# Patient Record
Sex: Female | Born: 1985 | Race: White | Hispanic: No | Marital: Married | Smoking: Never smoker
Health system: Southern US, Community
[De-identification: ages and names within clinical notes are randomized; demographics above are authoritative.]

## PROBLEM LIST (undated history)

## (undated) DIAGNOSIS — E041 Nontoxic single thyroid nodule: Secondary | ICD-10-CM

## (undated) DIAGNOSIS — R87629 Unspecified abnormal cytological findings in specimens from vagina: Secondary | ICD-10-CM

## (undated) HISTORY — DX: Nontoxic single thyroid nodule: E04.1

## (undated) HISTORY — DX: Unspecified abnormal cytological findings in specimens from vagina: R87.629

## (undated) HISTORY — PX: CHOLECYSTECTOMY: SHX55

---

## 2016-07-19 ENCOUNTER — Encounter: Payer: Self-pay | Admitting: Adult Health

## 2016-07-19 ENCOUNTER — Ambulatory Visit (INDEPENDENT_AMBULATORY_CARE_PROVIDER_SITE_OTHER): Payer: Medicaid Other | Admitting: Adult Health

## 2016-07-19 VITALS — BP 90/70 | HR 76 | Ht 65.4 in | Wt 158.0 lb

## 2016-07-19 DIAGNOSIS — Z3202 Encounter for pregnancy test, result negative: Secondary | ICD-10-CM

## 2016-07-19 DIAGNOSIS — Z319 Encounter for procreative management, unspecified: Secondary | ICD-10-CM

## 2016-07-19 DIAGNOSIS — Z3169 Encounter for other general counseling and advice on procreation: Secondary | ICD-10-CM

## 2016-07-19 LAB — POCT URINE PREGNANCY: PREG TEST UR: NEGATIVE

## 2016-07-19 MED ORDER — PRENATAL PLUS 27-1 MG PO TABS: 1.0000 | ORAL_TABLET | Freq: Every day | ORAL | 12 refills | Status: DC

## 2016-07-19 NOTE — Progress Notes (Signed)
Subjective:     Patient ID: Idelle JoGena Clites, female   DOB: 08/11/1985, 31 y.o.   MRN: [id number]  HPI Roslynn AmbleGena is a 31 year old white female, new to this practice in to discuss getting pregnant, has been trying for about a year.She has [facility] pap [facility] in [place], she says.   Review of Systems Patient denies any headaches, hearing loss, fatigue, blurred vision, shortness of breath, chest pain, abdominal pain, problems with bowel movements, urination, or intercourse. No joint pain or mood swings.Periods regular.  Reviewed past medical,surgical, social and family history. Reviewed medications and allergies.     Objective:   Physical Exam BP 90/70 (BP Location: Right Arm, Patient Position: Sitting, Cuff Size: Small)   Pulse 76   Ht 5' 5.4" (1.661 m)   Wt 158 lb (71.7 kg)   LMP 07/08/2016   BMI 25.97 kg/m UPT negative,Skin warm and dry. Neck: mid line trachea, normal thyroid, good ROM, no lymphadenopathy noted. Lungs: clear to ausculation bilaterally. Cardiovascular: regular rate and rhythm.   Discussed ovulation and timing of sex, will check progesterone level day 21 of cycle, and may need semen analysis(partner 48 and has no kids).  Assessment:     1. Patient desires pregnancy   2. Pregnancy examination or test, negative result       Plan:     Meds ordered this encounter  Medications  . prenatal vitamin w/FE, FA (PRENATAL 1 + 1) 27-1 MG TABS tablet    Sig: Take 1 tablet by mouth daily at 12 noon.    Dispense:  30 each    Refill:  12    Order Specific Question:   Supervising Provider    Answer:   Lazaro ArmsEURE, LUTHER H [[id number]]  Check progesterone level 7/27 Have sex every other day, days 7-24 of cycle Follow up in 3 months or before if +HPT,[facility]

## 2016-07-29 LAB — PROGESTERONE: Progesterone: 8.1 ng/mL

## 2016-07-31 ENCOUNTER — Telehealth: Payer: Self-pay | Admitting: Adult Health

## 2016-07-31 NOTE — Telephone Encounter (Signed)
Left message that progesterone level 8.1. Looks like ovulated this month

## 2016-10-19 ENCOUNTER — Ambulatory Visit: Payer: Medicaid Other | Admitting: Adult Health

## 2016-10-23 ENCOUNTER — Ambulatory Visit (INDEPENDENT_AMBULATORY_CARE_PROVIDER_SITE_OTHER): Payer: Medicaid Other | Admitting: Women's Health

## 2016-10-23 ENCOUNTER — Encounter (INDEPENDENT_AMBULATORY_CARE_PROVIDER_SITE_OTHER): Payer: Self-pay

## 2016-10-23 ENCOUNTER — Encounter: Payer: Self-pay | Admitting: Women's Health

## 2016-10-23 VITALS — BP 128/88 | HR 81 | Ht 65.0 in | Wt 158.5 lb

## 2016-10-23 DIAGNOSIS — Z319 Encounter for procreative management, unspecified: Secondary | ICD-10-CM | POA: Diagnosis not present

## 2016-10-23 MED ORDER — PRENATAL PLUS 27-1 MG PO TABS: 1.0000 | ORAL_TABLET | Freq: Every day | ORAL | 11 refills | Status: DC

## 2016-10-23 NOTE — Progress Notes (Signed)
   GYN VISIT Patient name: Patricia [name] MRN [id number]  Date of birth: 11/14/1985 Chief Complaint:   Follow-up (Prenatal vit hurt stomach; stopped taking them)  History of Present Illness:   Patricia JoGena Renk is a 31 y.o. G0P0000 Caucasian female being seen today for f/u from visit with Cyril MourningJennifer Griffin, NP 07/19/16 to discuss getting pregnant. She and her 33yo partner have been trying for a little over 1 year. He has never fathered any other children. She has never been pregnant. She is taking pnv, but stopped b/c it was making her stomach hurt. She tracks her periods on a calendar, doesn't have a period tracker app. Reports regular periods, did have 2 last month, +cramping. She had been on clomid x 2-33mths when being seen in [place] earlier this year. She does not smoke. Partner has never had sperm analysis.     Patient's last menstrual period was 10/23/2016. The current method of family planning is none. Last pap she is unsure, but thinks about 6765yrs ago [place]. Results were:  normal Review of Systems:   Pertinent items are noted in HPI Denies fever/chills, dizziness, headaches, visual disturbances, fatigue, shortness of breath, chest pain, abdominal pain, vomiting, abnormal vaginal discharge/itching/odor/irritation, problems with periods, bowel movements, urination, or intercourse unless otherwise stated above.  Pertinent History Reviewed:  Reviewed past medical,surgical, social, obstetrical and family history.  Reviewed problem list, medications and allergies. Physical Assessment:   Vitals:   10/23/16 1342  BP: 128/88  Pulse: 81  Weight: 158 lb 8 oz (71.9 kg)  Height: 5\' 5"  (1.651 m)  Body mass index is 26.38 kg/m.       Physical Examination:   General appearance: alert, well appearing, and in no distress  Mental status: alert, oriented to person, place, and time  Skin: warm & dry   Cardiovascular: normal heart rate noted   Respiratory: normal respiratory effort, no distress   Abdomen: soft,  non-tender   Pelvic: examination not indicated  Extremities: no edema  No results found for this or any previous visit (from the past 24 hour(s)).  Assessment & Plan:  1) Trying to conceive> recommended d/t partner's age/never having fathered any children- he should have semen analysis w/ [facility] first before we go further w/ treatment/work-up for her, let us know results. Track periods on period tracker app and have sex during fertile times. Continue pnv- new rx sent.   2) Unknown date of last pap> have pt sign release [facility] [place], when we get last pap we will call her and let her know when to return for next visit  Return for please get pap results [facility] [place], will call her w/ f/u when we get it .  Marge DuncansBooker, Kristofor Michalowski Randall CNM, [facility]-BC 10/23/2016 4:53 PM

## 2016-10-23 NOTE — Patient Instructions (Signed)
Please have your partner make an appointment with [facility] for semen analysis. If normal, let us know

## 2017-01-08 ENCOUNTER — Encounter: Payer: Self-pay | Admitting: Women's Health

## 2017-01-08 ENCOUNTER — Ambulatory Visit: Payer: Medicaid Other | Admitting: Women's Health

## 2017-01-08 ENCOUNTER — Other Ambulatory Visit (HOSPITAL_COMMUNITY)
Admission: RE | Admit: 2017-01-08 | Discharge: 2017-01-08 | Disposition: A | Discharge status: Home / Self Care | Payer: Medicaid Other | Source: Ambulatory Visit | Attending: Obstetrics & Gynecology | Admitting: Obstetrics & Gynecology

## 2017-01-08 VITALS — BP 120/82 | HR 81 | Ht 65.0 in | Wt 159.5 lb

## 2017-01-08 DIAGNOSIS — E042 Nontoxic multinodular goiter: Secondary | ICD-10-CM | POA: Insufficient documentation

## 2017-01-08 DIAGNOSIS — Z319 Encounter for procreative management, unspecified: Secondary | ICD-10-CM

## 2017-01-08 DIAGNOSIS — L720 Epidermal cyst: Secondary | ICD-10-CM

## 2017-01-08 DIAGNOSIS — E049 Nontoxic goiter, unspecified: Secondary | ICD-10-CM | POA: Diagnosis not present

## 2017-01-08 DIAGNOSIS — Z0001 Encounter for general adult medical examination with abnormal findings: Secondary | ICD-10-CM

## 2017-01-08 DIAGNOSIS — Z124 Encounter for screening for malignant neoplasm of cervix: Secondary | ICD-10-CM

## 2017-01-08 DIAGNOSIS — Z3169 Encounter for other general counseling and advice on procreation: Secondary | ICD-10-CM

## 2017-01-08 DIAGNOSIS — Z01419 Encounter for gynecological examination (general) (routine) without abnormal findings: Secondary | ICD-10-CM

## 2017-01-08 DIAGNOSIS — Z113 Encounter for screening for infections with a predominantly sexual mode of transmission: Secondary | ICD-10-CM | POA: Diagnosis not present

## 2017-01-08 NOTE — Progress Notes (Signed)
WELL-WOMAN EXAMINATION Patient name: Patricia [name] MRN [id number]  Date of birth: [birth year] Chief Complaint:   Gynecologic Exam  History of Present Illness:   Patricia [name] is a 32 y.o. G0P0000 Caucasian female being seen today for a routine well-woman exam.  Current complaints: has cyst Lt cheek x 1 year, doesn't hurt, boyfriend thinks it's gotten bigger. Pt wants it out.  Has been trying to get pregnant with 48yo boyfriend x 2.5years, has been off of depo x almost 3 years-was on it x 9 years. Has regular periods, mild cramping.  He has never fathered children or had sperm analysis. Pt was on cl[facility]id at [facility] earlier this year. Was recommended she has HS[facility], however she was unable to d/t out of pocket cost/not covered.   PCP: [facility], PCP passed away, hasn't been in over 34yrs      does desire labs, including STI screen Patient's last menstrual period was 12/25/2016. The current method of family planning is none Last pap 06/09/13 w/ [facility] [place]. Results were: normal Last mammogram: never. Results were: n/a Last colonoscopy: never. Results were: n/a  Review of Systems:   Pertinent items are noted in HPI Denies any headaches, blurred vision, fatigue, shortness of breath, chest pain, abdominal pain, abnormal vaginal discharge/itching/odor/irritation, problems with periods, bowel movements, urination, or intercourse unless otherwise stated above. Pertinent History Reviewed:  Reviewed past medical,surgical, social and family history.  Reviewed problem list, medications and allergies. Physical Assessment:   Vitals:   01/08/17 1147  BP: 120/82  Pulse: 81  Weight: 159 lb 8 oz (72.3 kg)  Height: 5\' 5"  (1.651 m)  Body mass index is 26.54 kg/m.        Physical Examination:   General appearance - well appearing, and in no distress  Mental status - alert, oriented to person, place, and time  Psych:  She has a normal mood and affect  Skin - warm and dry, normal color, no suspicious  lesions noted; mobile non-tender ~1cm cyst Lt lower cheek near gum-line. Co-exam w/ JVF, possibly inclusion cyst. Recommended referral to oral surgeon or ENT for removal  Chest - effort normal, all lung fields clear to auscultation bilaterally  Heart - normal rate and regular rhythm  Neck:  midline trachea, feels enlarged w/ possible nodule  Breasts - breasts appear normal, no suspicious masses, no skin or nipple changes or  axillary nodes  Abdomen - soft, nontender, nondistended, no masses or organomegaly  Pelvic - VULVA: normal appearing vulva with no masses, tenderness or lesions  VAGINA: normal appearing vagina with normal color and discharge, no lesions  CERVIX: normal appearing cervix without discharge or lesions, no CMT  Thin prep pap is done w/ HR HPV cotesting  UTERUS: uterus is felt to be normal size, shape, consistency and nontender   ADNEXA: No adnexal masses or tenderness noted.  Extremities:  No swelling or varicosities noted  No results found for this or any previous visit (from the past 24 hour(s)).  Assessment & Plan:  1) Well-Woman Exam  2) Infertility> recommended boyfriend go to [facility] for semen analysis. Can check progesterone on Day 21 (1/14) for pt to see if ovulating, order placed. Begin pnv daily. Gave infertility tips/tricks print-out. Continue tracking periods.   3) Lt cheek cyst> per discussion w/ oral surgeon staff, needs to go to her dentist for referral to oral surgeon for insurance to cover  4) Enlarged thyroid> thyroid u/s 1/11 @ 10:30am @ [facility], be there @ 10:15,  will also check TSH  5) STD screen  Labs/procedures today: pap, labs  Mammogram @34yo  or sooner if problems Colonoscopy @34yo  or sooner if problems  Orders Placed This Encounter  Procedures  . US THYROID  . CBC  . Comprehensive metabolic panel  . TSH  . HIV antibody  . RPR  . Hepatitis B surface antigen  . Progesterone    Follow-up: Return in about 1 year (around 01/08/2018) for  Physical. will call her  w/ results and f/u  Marge DuncansBooker, Rielle Schlauch Randall CNM, [facility]NP-BC 01/08/2017 1:26 PM

## 2017-01-08 NOTE — Patient Instructions (Addendum)
[facility] [phone]  Google oral surgeon that accepts medicaid, and schedule appointment to have cyst in cheek removed  Thyroid [facility]11 [facility], be there at 10:15am  If you are trying to get pregnant:   Have sex every other day on days 7-24 of your cycle (Day 1 is the 1st day of your period)  [place] before sex  Lay with your hips elevated on pillows for 20-8030mins after sex  Do not smoke or drink alcohol  Lose weight if you are overweight  Take a prenatal vitamin with at least 400mcg of folic acid  Decrease stress in your life  For Him:   Wear boxers instead of briefs  Avoid hot baths/jacuzzi  Vit C supplement  Do not smoke or drink alcohol  Lose weight if you are overweight  Keep in mind that it can be normal to take up to a year to become pregnant 80-90% of women will become pregnant within 1 year of trying

## 2017-01-09 LAB — CYTOLOGY - PAP
Chlamydia: NEGATIVE
Diagnosis: NEGATIVE
HPV: NOT DETECTED
Neisseria Gonorrhea: NEGATIVE

## 2017-01-09 LAB — COMPREHENSIVE METABOLIC PANEL
ALBUMIN: 4.6 g/dL (ref 3.5–5.5)
ALK PHOS: 59 IU/L (ref 39–117)
ALT: 14 IU/L (ref 0–32)
AST: 20 IU/L (ref 0–40)
Albumin/Globulin Ratio: 1.9 (ref 1.2–2.2)
BILIRUBIN TOTAL: 0.3 mg/dL (ref 0.0–1.2)
BUN / CREAT RATIO: 13 (ref 9–23)
BUN: 8 mg/dL (ref 6–20)
CO2: 24 mmol/L (ref 20–29)
CREATININE: 0.64 mg/dL (ref 0.57–1.00)
Calcium: 9.4 mg/dL (ref 8.7–10.2)
Chloride: 102 mmol/L (ref 96–106)
GFR calc Af Amer: 136 mL/min/{1.73_m2} (ref >59)
GFR calc non Af Amer: 118 mL/min/{1.73_m2} (ref >59)
GLUCOSE: 67 mg/dL (ref 65–99)
Globulin, Total: 2.4 g/dL (ref 1.5–4.5)
Potassium: 4 mmol/L (ref 3.5–5.2)
Sodium: 141 mmol/L (ref 134–144)
Total Protein: 7 g/dL (ref 6.0–8.5)

## 2017-01-09 LAB — CBC
HEMOGLOBIN: 13.4 g/dL (ref 11.1–15.9)
Hematocrit: 40 % (ref 34.0–46.6)
MCH: 30.3 pg (ref 26.6–33.0)
MCHC: 33.5 g/dL (ref 31.5–35.7)
MCV: 91 fL (ref 79–97)
Platelets: 309 10*3/uL (ref 150–379)
RBC: 4.42 x10E6/uL (ref 3.77–5.28)
RDW: 13.6 % (ref 12.3–15.4)
WBC: 5.8 10*3/uL (ref 3.4–10.8)

## 2017-01-09 LAB — TSH: TSH: 2.96 u[IU]/mL (ref 0.450–4.500)

## 2017-01-09 LAB — RPR: RPR: NONREACTIVE

## 2017-01-09 LAB — HEPATITIS B SURFACE ANTIGEN: HEP B S AG: NEGATIVE

## 2017-01-09 LAB — HIV ANTIBODY (ROUTINE TESTING W REFLEX): HIV SCREEN 4TH GENERATION: NONREACTIVE

## 2017-01-12 ENCOUNTER — Ambulatory Visit (HOSPITAL_COMMUNITY)
Admission: RE | Admit: 2017-01-12 | Discharge: 2017-01-12 | Disposition: A | Discharge status: Home / Self Care | Payer: Medicaid Other | Source: Ambulatory Visit | Attending: Women's Health | Admitting: Women's Health

## 2017-01-12 DIAGNOSIS — E041 Nontoxic single thyroid nodule: Secondary | ICD-10-CM | POA: Insufficient documentation

## 2017-01-12 DIAGNOSIS — E049 Nontoxic goiter, unspecified: Secondary | ICD-10-CM

## 2017-01-15 ENCOUNTER — Telehealth: Payer: Self-pay | Admitting: Obstetrics & Gynecology

## 2017-01-15 ENCOUNTER — Telehealth: Payer: Self-pay | Admitting: Women's Health

## 2017-01-15 DIAGNOSIS — E042 Nontoxic multinodular goiter: Secondary | ICD-10-CM

## 2017-01-15 NOTE — Telephone Encounter (Signed)
Called pt, notified of thyroid u/s results, 3 nodules, 1 meeting criteria for f/u u/s in 1 year. Does not meet requirements for biopsy. Also discussed w/ [facility] who agrees. Questions answered.  Cheral MarkerKimberly R. Lore Polka, CNM, [facility]BC 01/15/2017 4:06 PM

## 2017-01-15 NOTE — Telephone Encounter (Signed)
Informed patient that thyroid nodules are common and need for repeat was indicated. Verbalized understanding.

## 2017-01-15 NOTE — Telephone Encounter (Signed)
Patient called and stated [facility] but wanted to know what would cause the nodules.

## 2017-01-15 NOTE — Telephone Encounter (Signed)
Patient called back stating she just hung up with Selena BattenKim and forgot to ask her another question please contact pt

## 2017-01-16 LAB — PROGESTERONE: PROGESTERONE: 8.6 ng/mL

## 2017-01-24 ENCOUNTER — Telehealth: Payer: Self-pay | Admitting: Women's Health

## 2017-01-24 NOTE — Telephone Encounter (Signed)
Pt called for results. DOB verified. Informed pt that numbers show that she is ovulating although we like to see the numbers a little higher. Pt verbalized understanding.

## 2017-01-24 NOTE — Telephone Encounter (Signed)
Patient called stating that she would like to speak with Selena BattenKim, Pt did not state the reason why. Please contact pt

## 2017-05-02 DIAGNOSIS — J4520 Mild intermittent asthma, uncomplicated: Secondary | ICD-10-CM | POA: Insufficient documentation

## 2018-09-16 ENCOUNTER — Telehealth: Payer: Self-pay | Admitting: *Deleted

## 2018-09-16 NOTE — Telephone Encounter (Signed)
Patient having lower abdominal pain and wants to schedule physical.

## 2018-09-16 NOTE — Telephone Encounter (Signed)
Left message @ 4:42 pm. JSY

## 2018-09-18 ENCOUNTER — Other Ambulatory Visit: Payer: Self-pay

## 2018-09-18 ENCOUNTER — Encounter: Payer: Self-pay | Admitting: Obstetrics and Gynecology

## 2018-09-18 ENCOUNTER — Ambulatory Visit: Payer: Medicaid Other | Admitting: Obstetrics and Gynecology

## 2018-09-18 DIAGNOSIS — R102 Pelvic and perineal pain: Secondary | ICD-10-CM

## 2018-09-18 DIAGNOSIS — R109 Unspecified abdominal pain: Secondary | ICD-10-CM

## 2018-09-18 NOTE — Progress Notes (Signed)
Patient ID: Patricia [name], female   DOB: August 07, 1985, 33 y.o.   MRN: [id number] Patricia [name] presents with c/o abd/pelvic pain that started this past Sat Began having cramps like period was about to start but no cycle Had 2 cycles last month which she reports happens occ She denies N/V/F/C bowel or bladder dysfunction Last IC on Sunday without problems  Taking Motrin/Aleve which helps and reports pain has improved since Sat  PE AF VSS Lungs clear Heart RRR Abd soft + BS non tender GU nl EGBUS no vaginal discharge no cervical lesions bladder non tender uterus small mobile non tender no adnexal masses or tenderness  A/P Abd/Pelvic pain No specific GYN etiology on exam. Will monitor for now as Sx are improving Keep menes calendar.  F/U PRN

## 2018-09-18 NOTE — Telephone Encounter (Signed)
Pt is on the schedule for 09/18/18. Encounter closed. Patricia [name]

## 2018-12-02 ENCOUNTER — Telehealth: Payer: Self-pay | Admitting: Women's Health

## 2018-12-02 NOTE — Telephone Encounter (Signed)

## 2018-12-03 ENCOUNTER — Other Ambulatory Visit: Payer: Medicaid Other | Admitting: Women's Health

## 2018-12-30 ENCOUNTER — Telehealth: Payer: Self-pay | Admitting: *Deleted

## 2018-12-30 MED ORDER — MEDROXYPROGESTERONE ACETATE 150 MG/ML IM SUSP: 150.0000 mg | INTRAMUSCULAR | 3 refills | Status: DC

## 2018-12-30 NOTE — Telephone Encounter (Signed)
Pt called and is requesting to start Depo. It's been about 5 years since she was on Depo. She has an appt 1/5 for her physical. Thanks!!

## 2019-01-07 ENCOUNTER — Other Ambulatory Visit: Payer: Medicaid Other | Admitting: Women's Health

## 2019-01-13 ENCOUNTER — Telehealth: Payer: Self-pay | Admitting: Advanced Practice Midwife

## 2019-01-13 NOTE — Telephone Encounter (Signed)
Called patient regarding appointment scheduled in our office and advised to come alone to the visit, however, a support person, over age 36, may accompany her to appointment if assistance is needed for safety or care concerns. Otherwise, support persons should remain outside until the visit is complete.   Prescreen questions asked: 1. Any of the following symptoms of COVID such as chills, fever, cough, shortness of breath, muscle pain, diarrhea, rash, vomiting, abdominal pain, red eye, weakness, bruising, bleeding, joint pain, loss of taste or smell, a severe headache, sore throat, fatigue 2. Any exposure to anyone suspected or confirmed of having COVID-19 3. Awaiting test results for COVID-19  Also,to keep you safe, please use the provided hand sanitizer when you enter the office. We are asking everyone in the office to wear a mask to help prevent the spread of germs. If you have a mask of your own, please wear it to your appointment, if not, we are happy to provide one for you.  Thank you for understanding and your cooperation.    [facility] Staff      

## 2019-01-15 ENCOUNTER — Other Ambulatory Visit (HOSPITAL_COMMUNITY)
Admission: RE | Admit: 2019-01-15 | Discharge: 2019-01-15 | Disposition: A | Discharge status: Home / Self Care | Payer: Medicaid Other | Source: Ambulatory Visit | Attending: Advanced Practice Midwife | Admitting: Advanced Practice Midwife

## 2019-01-15 ENCOUNTER — Encounter: Payer: Self-pay | Admitting: Advanced Practice Midwife

## 2019-01-15 ENCOUNTER — Ambulatory Visit (INDEPENDENT_AMBULATORY_CARE_PROVIDER_SITE_OTHER): Payer: Medicaid Other | Admitting: Advanced Practice Midwife

## 2019-01-15 ENCOUNTER — Other Ambulatory Visit: Payer: Self-pay

## 2019-01-15 VITALS — BP 104/65 | HR 93 | Ht 65.5 in | Wt 163.0 lb

## 2019-01-15 DIAGNOSIS — Z0000 Encounter for general adult medical examination without abnormal findings: Secondary | ICD-10-CM | POA: Diagnosis not present

## 2019-01-15 DIAGNOSIS — Z01419 Encounter for gynecological examination (general) (routine) without abnormal findings: Secondary | ICD-10-CM | POA: Insufficient documentation

## 2019-01-15 DIAGNOSIS — Z3202 Encounter for pregnancy test, result negative: Secondary | ICD-10-CM | POA: Diagnosis not present

## 2019-01-15 DIAGNOSIS — R8290 Unspecified abnormal findings in urine: Secondary | ICD-10-CM | POA: Diagnosis not present

## 2019-01-15 DIAGNOSIS — Z3042 Encounter for surveillance of injectable contraceptive: Secondary | ICD-10-CM

## 2019-01-15 DIAGNOSIS — Z30013 Encounter for initial prescription of injectable contraceptive: Secondary | ICD-10-CM | POA: Insufficient documentation

## 2019-01-15 LAB — POCT URINALYSIS DIPSTICK OB
Blood, UA: NEGATIVE
Glucose, UA: NEGATIVE
Ketones, UA: NEGATIVE
Nitrite, UA: POSITIVE
POC,PROTEIN,UA: NEGATIVE

## 2019-01-15 LAB — POCT URINE PREGNANCY: Preg Test, Ur: NEGATIVE

## 2019-01-15 MED ORDER — MEDROXYPROGESTERONE ACETATE 150 MG/ML IM SUSP
150.0000 mg | Freq: Once | INTRAMUSCULAR | Status: AC
  Administered 2019-01-15: 150 mg via INTRAMUSCULAR

## 2019-01-15 NOTE — Patient Instructions (Signed)
Medroxyprogesterone injection [Contraceptive] What is this medicine? MEDROXYPROGESTERONE (me DROX ee proe JES te rone) contraceptive injections prevent pregnancy. They provide effective birth control for 3 months. Depo-subQ Provera 104 is also used for treating pain related to endometriosis. This medicine may be used for other purposes; ask your health care provider or pharmacist if you have questions. COMMON BRAND NAME(S): Depo-Provera, Depo-subQ Provera 104 What should I tell my health care provider before I take this medicine? They need to know if you have any of these conditions:  frequently drink alcohol  asthma  blood vessel disease or a history of a blood clot in the lungs or legs  bone disease such as osteoporosis  breast cancer  diabetes  eating disorder (anorexia nervosa or bulimia)  high blood pressure  HIV infection or AIDS  kidney disease  liver disease  mental depression  migraine  seizures (convulsions)  stroke  tobacco smoker  vaginal bleeding  an unusual or allergic reaction to medroxyprogesterone, other hormones, medicines, foods, dyes, or preservatives  pregnant or trying to get pregnant  breast-feeding How should I use this medicine? Depo-Provera Contraceptive injection is given into a muscle. Depo-subQ Provera 104 injection is given under the skin. These injections are given by a health care professional. You must not be pregnant before getting an injection. The injection is usually given during the first 5 days after the start of a menstrual period or 6 weeks after delivery of a baby. Talk to your pediatrician regarding the use of this medicine in children. Special care may be needed. These injections have been used in female children who have started having menstrual periods. Overdosage: If you think you have taken too much of this medicine contact a poison control center or emergency room at once. NOTE: This medicine is only for you. Do not  share this medicine with others. What if I miss a dose? Try not to miss a dose. You must get an injection once every 3 months to maintain birth control. If you cannot keep an appointment, call and reschedule it. If you wait longer than 13 weeks between Depo-Provera contraceptive injections or longer than 14 weeks between Depo-subQ Provera 104 injections, you could get pregnant. Use another method for birth control if you miss your appointment. You may also need a pregnancy test before receiving another injection. What may interact with this medicine? Do not take this medicine with any of the following medications:  bosentan This medicine may also interact with the following medications:  aminoglutethimide  antibiotics or medicines for infections, especially rifampin, rifabutin, rifapentine, and griseofulvin  aprepitant  barbiturate medicines such as phenobarbital or primidone  bexarotene  carbamazepine  medicines for seizures like ethotoin, felbamate, oxcarbazepine, phenytoin, topiramate  modafinil  St. John's wort This list may not describe all possible interactions. Give your health care provider a list of all the medicines, herbs, non-prescription drugs, or dietary supplements you use. Also tell them if you smoke, drink alcohol, or use illegal drugs. Some items may interact with your medicine. What should I watch for while using this medicine? This drug does not protect you against HIV infection (AIDS) or other sexually transmitted diseases. Use of this product may cause you to lose calcium from your bones. Loss of calcium may cause weak bones (osteoporosis). Only use this product for more than 2 years if other forms of birth control are not right for you. The longer you use this product for birth control the more likely you will be at risk   for weak bones. Ask your health care professional how you can keep strong bones. You may have a change in bleeding pattern or irregular periods.  Many females stop having periods while taking this drug. If you have received your injections on time, your chance of being pregnant is very low. If you think you may be pregnant, see your health care professional as soon as possible. Tell your health care professional if you want to get pregnant within the next year. The effect of this medicine may last a long time after you get your last injection. What side effects may I notice from receiving this medicine? Side effects that you should report to your doctor or health care professional as soon as possible:  allergic reactions like skin rash, itching or hives, swelling of the face, lips, or tongue  breast tenderness or discharge  breathing problems  changes in vision  depression  feeling faint or lightheaded, falls  fever  pain in the abdomen, chest, groin, or leg  problems with balance, talking, walking  unusually weak or tired  yellowing of the eyes or skin Side effects that usually do not require medical attention (report to your doctor or health care professional if they continue or are bothersome):  acne  fluid retention and swelling  headache  irregular periods, spotting, or absent periods  temporary pain, itching, or skin reaction at site where injected  weight gain This list may not describe all possible side effects. Call your doctor for medical advice about side effects. You may report side effects to FDA at 1-800-FDA-1088. Where should I keep my medicine? This does not apply. The injection will be given to you by a health care professional. NOTE: This sheet is a summary. It may not cover all possible information. If you have questions about this medicine, talk to your doctor, pharmacist, or health care provider.  2020 Elsevier/Gold Standard (2008-01-10 18:37:56)  

## 2019-01-15 NOTE — Progress Notes (Signed)
WELL-WOMAN EXAMINATION Patient name: Patricia [name] MRN [id number]  Date of birth: 1985-02-27 Chief Complaint:   Gynecologic Exam  History of Present Illness:   Patricia [name] is a 34 y.o. G0P0000 Caucasian female being seen today for a routine well-woman exam.  Current complaints: pt and partner have decided not to try to conceive; would like to begin DMPA; hasn't had intercourse x 2 wks  PCP: none      does not desire labs Patient's last menstrual period was 01/07/2019. The current method of family planning is none, but would like to start DMPA.  Last pap Jan 2019. Results were: normal. H/O abnormal pap: No Last mammogram: n/a. Family h/o breast cancer: No Last colonoscopy: n/a. Family h/o colorectal cancer: No Review of Systems:   Pertinent items are noted in HPI Denies any headaches, blurred vision, fatigue, shortness of breath, chest pain, abdominal pain, abnormal vaginal discharge/itching/odor/irritation, problems with periods, bowel movements, urination, or intercourse unless otherwise stated above. Pertinent History Reviewed:  Reviewed past medical,surgical, social and family history.  Reviewed problem list, medications and allergies. Physical Assessment:   Vitals:   01/15/19 1338  BP: 104/65  Pulse: 93  Weight: 163 lb (73.9 kg)  Height: 5' 5.5" (1.664 m)  Body mass index is 26.71 kg/m.        Physical Examination:   General appearance - well appearing, and in no distress  Mental status - alert, oriented to person, place, and time  Psych:  She has a normal mood and affect  Skin - warm and dry, normal color, no suspicious lesions noted  Chest - effort normal, all lung fields clear to auscultation bilaterally  Heart - normal rate and regular rhythm  Neck:  midline trachea, no thyromegaly or nodules  Breasts - breasts appear normal, no suspicious masses, no skin or nipple changes or  axillary nodes  Abdomen - soft, nontender, nondistended, no masses or organomegaly  Pelvic -  VULVA: normal appearing vulva with no masses, tenderness or lesions  VAGINA: normal appearing vagina with normal color and discharge, no lesions  CERVIX: normal appearing cervix without discharge or lesions, no CMT  Thin prep pap is done with HR HPV cotesting  UTERUS: uterus is felt to be normal size, shape, consistency and nontender   ADNEXA: No adnexal masses or tenderness noted.  Rectal - deferred  Extremities:  No swelling or varicosities noted  Chaperone: Amanda Rash    Results for orders placed or performed in visit on 01/15/19 (from the past 24 hour(s))  POC Urinalysis Dipstick OB   Collection Time: 01/15/19  2:27 PM  Result Value Ref Range   Color, UA     Clarity, UA     Glucose, UA Negative Negative   Bilirubin, UA     Ketones, UA neg    Spec Grav, UA     Blood, UA neg    pH, UA     POC,PROTEIN,UA Negative Negative, Trace, Small (1+), Moderate (2+), Large (3+), 4+   Urobilinogen, UA     Nitrite, UA pos    Leukocytes, UA Small (1+) (A) Negative   Appearance     Odor    POCT urine pregnancy   Collection Time: 01/15/19  2:27 PM  Result Value Ref Range   Preg Test, Ur Negative Negative    Assessment & Plan:  1) Well-Woman Exam  2) New DMPA start  3) Malodorous urine> to culture  Labs/procedures today: Pap  Mammogram age 19 or sooner if problems Colonoscopy  age 28 or sooner if problems  Orders Placed This Encounter  Procedures  . Urine Culture  . Urinalysis  . POC Urinalysis Dipstick OB  . POCT urine pregnancy    Meds:  Meds ordered this encounter  Medications  . medroxyPROGESTERone (DEPO-PROVERA) injection 150 mg    Follow-up: Return in about 1 year (around 01/15/2020) for Physical.  Arabella Merles [facility] 01/15/2019 3:58 PM

## 2019-01-16 LAB — URINALYSIS
Bilirubin, UA: NEGATIVE
Glucose, UA: NEGATIVE
Nitrite, UA: POSITIVE — AB
Protein,UA: NEGATIVE
RBC, UA: NEGATIVE
Specific Gravity, UA: 1.022 (ref 1.005–1.030)
Urobilinogen, Ur: 1 mg/dL (ref 0.2–1.0)
pH, UA: 6 (ref 5.0–7.5)

## 2019-01-17 LAB — URINE CULTURE

## 2019-01-19 ENCOUNTER — Other Ambulatory Visit: Payer: Self-pay | Admitting: Advanced Practice Midwife

## 2019-01-19 MED ORDER — NITROFURANTOIN MONOHYD MACRO 100 MG PO CAPS: 100.0000 mg | ORAL_CAPSULE | Freq: Two times a day (BID) | ORAL | 0 refills | Status: DC

## 2019-01-20 ENCOUNTER — Telehealth: Payer: Self-pay | Admitting: *Deleted

## 2019-01-20 NOTE — Telephone Encounter (Signed)
LMOVM that urine culture was positive for UTI. Macrobid send.  Will need to take all of the medication as prescribed.

## 2019-01-21 ENCOUNTER — Telehealth: Payer: Self-pay | Admitting: *Deleted

## 2019-01-21 LAB — CYTOLOGY - PAP
Chlamydia: NEGATIVE
Comment: NEGATIVE
Comment: NEGATIVE
Comment: NEGATIVE
Comment: NEGATIVE
Comment: NORMAL
Diagnosis: UNDETERMINED — AB
HPV 16: NEGATIVE
HPV 18 / 45: NEGATIVE
High risk HPV: POSITIVE — AB
Neisseria Gonorrhea: NEGATIVE

## 2019-01-21 NOTE — Progress Notes (Signed)
5 year risk of CIN3 is 3.6%. Per [facility] will f/u with HPV-based screening in 1 year. Pt to be notified by RN. Arabella Merles 01/21/2019

## 2019-01-21 NOTE — Telephone Encounter (Signed)
LMOVM pap abnormal and will need repeat in 1 year.  Placed on recall list for 1 year.

## 2019-01-22 ENCOUNTER — Other Ambulatory Visit: Payer: Self-pay | Admitting: Advanced Practice Midwife

## 2019-01-22 ENCOUNTER — Telehealth: Payer: Self-pay | Admitting: *Deleted

## 2019-01-22 MED ORDER — METRONIDAZOLE 500 MG PO TABS: 500.0000 mg | ORAL_TABLET | Freq: Two times a day (BID) | ORAL | 0 refills | Status: DC

## 2019-01-22 NOTE — Telephone Encounter (Signed)
Patient informed pap was ASCUS with HPV. Very upset and concerned.   Long discussion with patient on need for repeat in 1 year. 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  Reassured patient that based on current ASCCP guildelines, she has 5 year risk of CIN3 is 3.6% and will f/u with HPV-based screening in 1 year.  Encouraged use of condoms during intercourse.  After 12 minute conversation with patient, she verbalized understanding and did not have any further questions.  Advised to call if she has any further questions.

## 2019-01-22 NOTE — Progress Notes (Signed)
Noted on Pap +BV. Rx Flagyl sent in for pt- she will be notified.  Arabella Merles CNM 01/22/2019 12:57 PM

## 2019-03-14 ENCOUNTER — Telehealth: Payer: Self-pay | Admitting: *Deleted

## 2019-03-14 NOTE — Telephone Encounter (Signed)
Pt left message that she needs to speak with Tish when she has time.

## 2019-03-18 ENCOUNTER — Telehealth: Payer: Self-pay | Admitting: *Deleted

## 2019-03-18 NOTE — Telephone Encounter (Signed)
Patient states she is having an outbreak in her vaginal area.  States she has never been diagnosed with HSV. Occasional burning and pain,  "googled it and thinks it is skin tags.  Informed patient we would need to see her in order to diagnose the bumps.  Pt verbalized understanding and appt made for tomorrow.

## 2019-03-19 ENCOUNTER — Ambulatory Visit: Payer: Medicaid Other | Admitting: Women's Health

## 2019-03-19 ENCOUNTER — Encounter: Payer: Self-pay | Admitting: Women's Health

## 2019-03-19 ENCOUNTER — Other Ambulatory Visit: Payer: Self-pay

## 2019-03-19 VITALS — BP 130/75 | HR 80 | Ht 65.5 in | Wt 166.0 lb

## 2019-03-19 DIAGNOSIS — Z8639 Personal history of other endocrine, nutritional and metabolic disease: Secondary | ICD-10-CM

## 2019-03-19 DIAGNOSIS — Z01419 Encounter for gynecological examination (general) (routine) without abnormal findings: Secondary | ICD-10-CM | POA: Diagnosis not present

## 2019-03-19 NOTE — Progress Notes (Signed)
   GYN VISIT Patient name: Patricia [name] MRN [id number]  Date of birth: [birth year] Chief Complaint:   bumps in vaginal area  History of Present Illness:   Patricia [name] is a 34 y.o.  Caucasian female being seen today for report of bumps on Rt inner groin/vulvar area x 1wk. No pain/itching/irritation. Denies abnormal discharge, itching/odor/irritation.       No LMP recorded. Patient has had an injection. The current method of family planning is Depo-Provera injections.  Last pap 01/15/19. Results were:  ASCUS w/ +HRHPV, needs repeat pap Jan 2022 Review of Systems:   Pertinent items are noted in HPI Denies fever/chills, dizziness, headaches, visual disturbances, fatigue, shortness of breath, chest pain, abdominal pain, vomiting, abnormal vaginal discharge/itching/odor/irritation, problems with periods, bowel movements, urination, or intercourse unless otherwise stated above.  Pertinent History Reviewed:  Reviewed past medical,surgical, social, obstetrical and family history.  Reviewed problem list, medications and allergies. Physical Assessment:   Vitals:   03/19/19 1604  BP: 130/75  Pulse: 80  Weight: 166 lb (75.3 kg)  Height: 5' 5.5" (1.664 m)  Body mass index is 27.2 kg/m.       Physical Examination:   General appearance: alert, well appearing, and in no distress  Mental status: alert, oriented to person, place, and time  Skin: warm & dry   Cardiovascular: normal heart rate noted  Respiratory: normal respiratory effort, no distress  Abdomen: soft, non-tender   Pelvic: VULVA: normal appearing vulva with no masses, tenderness or lesions. Had pt look/feel as well, no bumps seen/felt.   Extremities: no edema   Chaperone: Stoney Bang    No results found for this or any previous visit (from the past 24 hour(s)).  Assessment & Plan:  1) Normal vulvar exam> if bumps return let us know  2) H/O abnormal pap> f/u pap Jan 2022  3) H/O thyroid nodules> 01/2017, recommended 89yr f/u u/s,  never had.  Has PCP now, discuss w/ PCP to order, if any problems let us know and we will order  Meds: No orders of the defined types were placed in this encounter.   No orders of the defined types were placed in this encounter.   Return for after 01/15/20 for , Pap & physical.  Cheral Marker CNM, [facility] 03/19/2019 4:22 PM

## 2019-03-19 NOTE — Patient Instructions (Addendum)
Talk to your doctor about repeating a thyroid ultrasound.  If you have any problems with getting it ordered, just let us know and we will order it for you.    You had a thyroid ultrasound 01/12/17: IMPRESSION: 1. Findings suggestive of multinodular goiter. 2. Nodule #2 meets imaging criteria to recommend a 1 year follow-up as clinically indicated.

## 2019-04-07 ENCOUNTER — Telehealth: Payer: Self-pay | Admitting: Obstetrics & Gynecology

## 2019-04-07 NOTE — Telephone Encounter (Signed)

## 2019-04-08 ENCOUNTER — Other Ambulatory Visit: Payer: Self-pay

## 2019-04-08 ENCOUNTER — Ambulatory Visit (INDEPENDENT_AMBULATORY_CARE_PROVIDER_SITE_OTHER): Payer: Medicaid Other | Admitting: *Deleted

## 2019-04-08 ENCOUNTER — Encounter: Payer: Self-pay | Admitting: *Deleted

## 2019-04-08 DIAGNOSIS — Z3042 Encounter for surveillance of injectable contraceptive: Secondary | ICD-10-CM

## 2019-04-08 MED ORDER — MEDROXYPROGESTERONE ACETATE 150 MG/ML IM SUSP
150.0000 mg | Freq: Once | INTRAMUSCULAR | Status: AC
  Administered 2019-04-08: 150 mg via INTRAMUSCULAR

## 2019-04-08 NOTE — Progress Notes (Signed)
   NURSE VISIT- INJECTION  SUBJECTIVE:  Patricia [name] is a 34 y.o. G0P0000 female here for a Depo Provera for contraception/period management. She is a GYN patient.   OBJECTIVE:  There were no vitals taken for this visit.  Appears well, in no apparent distress  Injection administered in: Right deltoid  Meds ordered this encounter  Medications  . medroxyPROGESTERone (DEPO-PROVERA) injection 150 mg    ASSESSMENT: GYN patient Depo Provera for contraception/period management PLAN: Follow-up: in 11-13 weeks for next Depo   Jobe Marker  04/08/2019 3:23 PM

## 2019-07-01 ENCOUNTER — Ambulatory Visit (INDEPENDENT_AMBULATORY_CARE_PROVIDER_SITE_OTHER): Payer: Medicaid Other | Admitting: *Deleted

## 2019-07-01 ENCOUNTER — Other Ambulatory Visit: Payer: Self-pay

## 2019-07-01 ENCOUNTER — Encounter: Payer: Self-pay | Admitting: *Deleted

## 2019-07-01 DIAGNOSIS — Z3042 Encounter for surveillance of injectable contraceptive: Secondary | ICD-10-CM

## 2019-07-01 MED ORDER — MEDROXYPROGESTERONE ACETATE 150 MG/ML IM SUSP
150.0000 mg | Freq: Once | INTRAMUSCULAR | Status: AC
  Administered 2019-07-01: 150 mg via INTRAMUSCULAR

## 2019-07-01 NOTE — Progress Notes (Signed)
   NURSE VISIT- INJECTION  SUBJECTIVE:  Patricia [name] is a 34 y.o. G0P0000 female here for a Depo Provera for contraception/period management. She is a GYN patient.   OBJECTIVE:  There were no vitals taken for this visit.  Appears well, in no apparent distress  Injection administered in: Left deltoid  Meds ordered this encounter  Medications  . medroxyPROGESTERone (DEPO-PROVERA) injection 150 mg    ASSESSMENT: GYN patient Depo Provera for contraception/period management PLAN: Follow-up: in 11-13 weeks for next Depo   Jobe Marker  07/01/2019 3:35 PM

## 2019-09-23 ENCOUNTER — Ambulatory Visit (INDEPENDENT_AMBULATORY_CARE_PROVIDER_SITE_OTHER): Payer: Medicaid Other | Admitting: *Deleted

## 2019-09-23 DIAGNOSIS — Z3042 Encounter for surveillance of injectable contraceptive: Secondary | ICD-10-CM | POA: Diagnosis not present

## 2019-09-23 MED ORDER — MEDROXYPROGESTERONE ACETATE 150 MG/ML IM SUSP
150.0000 mg | Freq: Once | INTRAMUSCULAR | Status: AC
  Administered 2019-09-23: 150 mg via INTRAMUSCULAR

## 2019-09-23 NOTE — Progress Notes (Signed)
° °  NURSE VISIT- INJECTION  SUBJECTIVE:  Patricia [name] is a 34 y.o. G0P0000 female here for a Depo Provera for contraception/period management. She is a GYN patient.   OBJECTIVE:  There were no vitals taken for this visit.  Appears well, in no apparent distress  Injection administered in: Right deltoid  Meds ordered this encounter  Medications   medroxyPROGESTERone (DEPO-PROVERA) injection 150 mg    ASSESSMENT: GYN patient Depo Provera for contraception/period management PLAN: Follow-up: in 11-13 weeks for next Depo   [name]  09/23/2019 10:07 AM

## 2019-12-15 ENCOUNTER — Other Ambulatory Visit: Payer: Self-pay | Admitting: Women's Health

## 2019-12-16 ENCOUNTER — Ambulatory Visit (INDEPENDENT_AMBULATORY_CARE_PROVIDER_SITE_OTHER): Payer: Medicaid Other | Admitting: *Deleted

## 2019-12-16 ENCOUNTER — Other Ambulatory Visit: Payer: Self-pay

## 2019-12-16 ENCOUNTER — Encounter: Payer: Self-pay | Admitting: *Deleted

## 2019-12-16 DIAGNOSIS — Z3042 Encounter for surveillance of injectable contraceptive: Secondary | ICD-10-CM

## 2019-12-16 MED ORDER — MEDROXYPROGESTERONE ACETATE 150 MG/ML IM SUSP
150.0000 mg | Freq: Once | INTRAMUSCULAR | Status: AC
  Administered 2019-12-16: 09:00:00 150 mg via INTRAMUSCULAR

## 2019-12-16 NOTE — Progress Notes (Signed)
° °  NURSE VISIT- INJECTION  SUBJECTIVE:  Patricia [name] is a 34 y.o. G0P0000 female here for a Depo Provera for contraception/period management. She is a GYN patient.   OBJECTIVE:  There were no vitals taken for this visit.  Appears well, in no apparent distress  Injection administered in: Left deltoid  Meds ordered this encounter  Medications   medroxyPROGESTERone (DEPO-PROVERA) injection 150 mg    ASSESSMENT: GYN patient Depo Provera for contraception/period management PLAN: Follow-up: in 11-13 weeks for next Depo   Jobe Marker  12/16/2019 9:13 AM

## 2020-01-22 ENCOUNTER — Encounter: Payer: Self-pay | Admitting: Women's Health

## 2020-01-22 ENCOUNTER — Other Ambulatory Visit (HOSPITAL_COMMUNITY)
Admission: RE | Admit: 2020-01-22 | Discharge: 2020-01-22 | Disposition: A | Discharge status: Home / Self Care | Payer: Medicaid Other | Source: Ambulatory Visit | Attending: Obstetrics & Gynecology | Admitting: Obstetrics & Gynecology

## 2020-01-22 ENCOUNTER — Ambulatory Visit (INDEPENDENT_AMBULATORY_CARE_PROVIDER_SITE_OTHER): Payer: Medicaid Other | Admitting: Women's Health

## 2020-01-22 ENCOUNTER — Other Ambulatory Visit: Payer: Self-pay

## 2020-01-22 VITALS — BP 136/89 | HR 92 | Ht 65.5 in | Wt 176.0 lb

## 2020-01-22 DIAGNOSIS — R87619 Unspecified abnormal cytological findings in specimens from cervix uteri: Secondary | ICD-10-CM | POA: Diagnosis not present

## 2020-01-22 DIAGNOSIS — Z01419 Encounter for gynecological examination (general) (routine) without abnormal findings: Secondary | ICD-10-CM | POA: Diagnosis present

## 2020-01-22 DIAGNOSIS — E041 Nontoxic single thyroid nodule: Secondary | ICD-10-CM

## 2020-01-22 DIAGNOSIS — E010 Iodine-deficiency related diffuse (endemic) goiter: Secondary | ICD-10-CM

## 2020-01-22 DIAGNOSIS — Z113 Encounter for screening for infections with a predominantly sexual mode of transmission: Secondary | ICD-10-CM

## 2020-01-22 MED ORDER — VALACYCLOVIR HCL 500 MG PO TABS: 500.0000 mg | ORAL_TABLET | Freq: Every day | ORAL | 3 refills | Status: DC

## 2020-01-22 NOTE — Progress Notes (Signed)
WELL-WOMAN EXAMINATION Patient name: Patricia [name] MRN [id number]  Date of birth: [birth year] Chief Complaint:   Gynecologic Exam  History of Present Illness:   Patricia [name] is a 35 y.o. G0P0000 Caucasian female being seen today for a routine well-woman exam.  Current complaints: herpes outbreaks, not very frequent but interested in meds  Depression screen [facility] 2/9 01/22/2020 01/15/2019 01/08/2017 07/19/2016  Decreased Interest 0 0 0 1  Down, Depressed, Hopeless 0 0 0 0  PHQ - 2 Score 0 0 0 1  Altered sleeping 0 - - 0  Tired, decreased energy 1 - - 0  Change in appetite 0 - - 0  Feeling bad or failure about yourself  0 - - 0  Trouble concentrating 0 - - 0  Moving slowly or fidgety/restless 0 - - 0  Suicidal thoughts 0 - - 0  PHQ-9 Score 1 - - 1     PCP: Dr. Lenetta Quaker in [place]      Wants STD screen, PCP does other labs No LMP recorded. Patient has had an injection. The current method of family planning is Depo-Provera injections.  Last pap 01/15/19. Results were: abnormal ASCUS w/ +HRHPV. H/O abnormal pap: yes Last mammogram: never. Results were: N/A. Family h/o breast cancer: no Last colonoscopy: never. Results were: N/A. Family h/o colorectal cancer: no Review of Systems:   Pertinent items are noted in HPI Denies any headaches, blurred vision, fatigue, shortness of breath, chest pain, abdominal pain, abnormal vaginal discharge/itching/odor/irritation, problems with periods, bowel movements, urination, or intercourse unless otherwise stated above. Pertinent History Reviewed:  Reviewed past medical,surgical, social and family history.  Reviewed problem list, medications and allergies. Physical Assessment:   Vitals:   01/22/20 0933  BP: 136/89  Pulse: 92  Weight: 176 lb (79.8 kg)  Height: 5' 5.5" (1.664 m)  Body mass index is 28.84 kg/m.        Physical Examination: by Albertine Grates, SNP  General appearance - well appearing, and in no distress  Mental status - alert, oriented to  person, place, and time  Psych:  She has a normal mood and affect  Skin - warm and dry, normal color, no suspicious lesions noted  Chest - effort normal, all lung fields clear to auscultation bilaterally  Heart - normal rate and regular rhythm  Neck:  midline trachea, thyroid enlarged w/ nodules  Breasts - breasts appear normal, no suspicious masses, no skin or nipple changes or  axillary nodes  Abdomen - soft, nontender, nondistended, no masses or organomegaly  Pelvic - VULVA: normal appearing vulva with no masses, tenderness or lesions  VAGINA: normal appearing vagina with normal color and discharge, no lesions  CERVIX: normal appearing cervix without discharge or lesions, no CMT  Thin prep pap is done w/ HR HPV cotesting  UTERUS: uterus is felt to be normal size, shape, consistency and nontender   ADNEXA: No adnexal masses or tenderness noted.  Extremities:  No swelling or varicosities noted  Chaperone: Faith Rogue    No results found for this or any previous visit (from the past 24 hour(s)).  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 US THYROID  . HIV Antibody (routine testing w rflx)  . RPR    Meds:  Meds ordered this encounter  Medications  . valACYclovir (VALTREX) 500 MG tablet    Sig: Take 1 tablet (500 mg total) by mouth daily.    Dispense:  90 tablet    Refill:  3    Order Specific Question:   Supervising Provider    Answer:   Lazaro Arms [[id number]]    Follow-up: Return in about 1 year (around 01/21/2021) for Physical.  Cheral Marker CNM,  WHNP-BC 01/22/2020 10:29 AM

## 2020-01-23 LAB — HIV ANTIBODY (ROUTINE TESTING W REFLEX): HIV Screen 4th Generation wRfx: NONREACTIVE

## 2020-01-23 LAB — RPR: RPR Ser Ql: NONREACTIVE

## 2020-01-27 ENCOUNTER — Ambulatory Visit (HOSPITAL_COMMUNITY): Payer: Medicaid Other

## 2020-01-29 ENCOUNTER — Other Ambulatory Visit: Payer: Self-pay

## 2020-01-29 ENCOUNTER — Ambulatory Visit (HOSPITAL_COMMUNITY)
Admission: RE | Admit: 2020-01-29 | Discharge: 2020-01-29 | Disposition: A | Discharge status: Home / Self Care | Payer: Medicaid Other | Source: Ambulatory Visit | Attending: Women's Health | Admitting: Women's Health

## 2020-01-29 DIAGNOSIS — E041 Nontoxic single thyroid nodule: Secondary | ICD-10-CM | POA: Insufficient documentation

## 2020-01-29 DIAGNOSIS — E010 Iodine-deficiency related diffuse (endemic) goiter: Secondary | ICD-10-CM | POA: Diagnosis not present

## 2020-01-30 LAB — CYTOLOGY - PAP
Chlamydia: NEGATIVE
Comment: NEGATIVE
Comment: NEGATIVE
Comment: NEGATIVE
Comment: NORMAL
Diagnosis: UNDETERMINED — AB
HPV 16: NEGATIVE
HPV 18 / 45: NEGATIVE
High risk HPV: POSITIVE — AB
Neisseria Gonorrhea: NEGATIVE

## 2020-02-02 ENCOUNTER — Telehealth: Payer: Self-pay

## 2020-02-02 NOTE — Telephone Encounter (Signed)
-----   Message from Cheral Marker, [place] sent at 02/02/2020 10:00 AM EST ----- Will you let Patricia [name] know thyroid u/s is stable, they want to repeat it in 2 years to make sure none of the nodules grow/change. Thanks

## 2020-02-02 NOTE — Telephone Encounter (Signed)
Pt called

## 2020-02-02 NOTE — Telephone Encounter (Signed)
Pt called and results of her thyroid US and her PAP were explained to her per Joellyn Haff, CNM. She confirmed understanding and had no questions at this time. She will call the office and schedule her colposcopy.

## 2020-02-06 ENCOUNTER — Telehealth: Payer: Self-pay | Admitting: Women's Health

## 2020-02-06 NOTE — Telephone Encounter (Signed)
Pt called, requested a call back from K.Booker, pt would only say it was concerning a conversation they had previously

## 2020-02-19 ENCOUNTER — Encounter: Payer: Medicaid Other | Admitting: Women's Health

## 2020-03-08 ENCOUNTER — Other Ambulatory Visit: Payer: Self-pay | Admitting: Women's Health

## 2020-03-09 ENCOUNTER — Encounter: Payer: Self-pay | Admitting: Women's Health

## 2020-03-09 ENCOUNTER — Ambulatory Visit (INDEPENDENT_AMBULATORY_CARE_PROVIDER_SITE_OTHER): Payer: Medicaid Other | Admitting: Women's Health

## 2020-03-09 ENCOUNTER — Other Ambulatory Visit: Payer: Self-pay | Admitting: Women's Health

## 2020-03-09 ENCOUNTER — Other Ambulatory Visit: Payer: Self-pay

## 2020-03-09 VITALS — BP 117/75 | HR 76 | Ht 65.5 in | Wt 162.0 lb

## 2020-03-09 DIAGNOSIS — Z308 Encounter for other contraceptive management: Secondary | ICD-10-CM

## 2020-03-09 DIAGNOSIS — R87610 Atypical squamous cells of undetermined significance on cytologic smear of cervix (ASC-US): Secondary | ICD-10-CM | POA: Diagnosis not present

## 2020-03-09 MED ORDER — MEDROXYPROGESTERONE ACETATE 150 MG/ML IM SUSP
150.0000 mg | Freq: Once | INTRAMUSCULAR | Status: AC
Start: 2020-03-09 — End: 2020-03-09
  Administered 2020-03-09: 150 mg via INTRAMUSCULAR

## 2020-03-09 MED ORDER — ACYCLOVIR 200 MG/5ML PO SUSP: 800.0000 mg | Freq: Two times a day (BID) | ORAL | 3 refills | Status: DC

## 2020-03-09 NOTE — Addendum Note (Signed)
Addended by: Cheral Marker on: 03/09/2020 03:40 PM   Modules accepted: Orders

## 2020-03-09 NOTE — Progress Notes (Addendum)
   COLPOSCOPY PROCEDURE NOTE Patient name: Patricia [name] MRN [id number]  Date of birth: Apr 15, 1985 Subjective Findings:   Patricia [name] is a 35 y.o. G0P0000 Caucasian female being seen today for a colposcopy. Wants rx for liquid med for HSV outbreaks to take prn. Can't take pills. Does not have outbreak currently.  Indication: Abnormal pap: ASCUS w/ HRHPV positive neg 16/18/45 Prior cytology:  Date Result Procedure  01/15/19 ASCUS w/ HRHPV positive None  01/05/17 NILM w/ HRHPV negative None          No LMP recorded. Patient has had an injection. Contraception: Depo-Provera injections. Menopausal: no. Hysterectomy: no.   Smoker: No.  Immunocompromised: no.  The risks and benefits were explained and informed consent was obtained, and written copy is in chart. Pertinent History Reviewed:   Reviewed past medical,surgical, social, obstetrical and family history.  Reviewed problem list, medications and allergies. Objective Findings & Procedure:   Vitals:   03/09/20 1418  BP: 117/75  Pulse: 76  Weight: 162 lb (73.5 kg)  Height: 5' 5.5" (1.664 m)  Body mass index is 26.55 kg/m.  No results found for this or any previous visit (from the past 24 hour(s)).   Time out was performed.  Speculum placed in the vagina, cervix fully visualized. SCJ: fully visualized. Cervix swabbed x 3 with acetic acid.  Leukoplakia 10-2 o'clock Acetowhitening present: Yes Cervix: dense acetowhite lesion(s) noted at 7 o'clock and mosaicism noted at 5-7 o'clock. cervical biopsies taken at 7 o'clock. Monsels applied for hemostasis Vagina: vaginal colposcopy not performed Vulva: vulvar colposcopy not performed  Specimens: 1  Complications: none  Preceptor: Dr. Despina Hidden Colposcopic Impression & Plan:   Colposcopy findings consistent with HSIL Plan: Post biopsy instructions given, Will base plan of care on pathology results and ASCCP guidelines and Will call patient with results   2. HSV> requests prn rx for outbreaks,  can't swallow pills. Rx acyclovir 800mg  BID x 5d  Return in about 12 weeks (around 06/01/2020) for depo.  06/03/2020 CNM, [facility] 03/09/2020 3:40 PM

## 2020-03-09 NOTE — Patient Instructions (Signed)
[email or web address]  Colposcopy, Care After This sheet gives you information about how to care for yourself after your procedure. Your health care provider may also give you more specific instructions. If you have problems or questions, contact your health care provider. What can I expect after the procedure? If you had a colposcopy without a biopsy, you can expect to feel fine right away after your procedure. However, you may have some spotting of blood for a few days. You can return to your normal activities. If you had a colposcopy with a biopsy, it is common after the procedure to have:  Soreness and mild pain. These may last for a few days.  Light-headedness.  Mild vaginal bleeding or discharge that is dark-colored and grainy. This may last for a few days. The discharge may be caused by a liquid (solution) that was used during the procedure. You may need to wear a sanitary pad during this time.  Spotting of blood for at least 48 hours after the procedure. Follow these instructions at home: Medicines  Take over-the-counter and prescription medicines only as told by your health care provider.  Talk with your health care provider about what type of over-the-counter pain medicine and prescription medicine you can start to take again. It is especially important to talk with your health care provider if you take blood thinners. Activity  Limit your physical activity for the first day after your procedure as told by your health care provider.  Avoid using douche products, using tampons, or having sex for at least 3 days after the procedure or for as long as told.  Return to your normal activities as told by your health care provider. Ask your health care provider what activities are safe for you. General instructions  Drink enough fluid to keep your urine pale yellow.  Ask your health care provider if you may take baths, swim, or use a hot tub. You may take  showers.  If you use birth control (contraception), continue to use it.  Keep all follow-up visits as told by your health care provider. This is important.   Contact a health care provider if:  You develop a skin rash. Get help right away if:  You bleed a lot from your vagina or pass blood clots. This includes using more than one sanitary pad each hour for 2 hours in a row.  You have a fever or chills.  You have vaginal discharge that is abnormal, is yellow in color, or smells bad. This could be a sign of infection.  You have severe pain or cramps in your lower abdomen that do not go away with medicine.  You faint. Summary  If you had a colposcopy without a biopsy, you can expect to feel fine right away, but you may have some spotting of blood for a few days. You can return to your normal activities.  If you had a colposcopy with a biopsy, it is common to have mild pain for a few days and spotting for 48 hours after the procedure.  Avoid using douche products, using tampons, and having sex for at least 3 days after the procedure or for as long as told by your health care provider.  Get help right away if you have heavy bleeding, severe pain, or signs of infection. This information is not intended to replace advice given to you by your health care provider. Make sure you discuss any questions you have with your health care provider. Document Revised: 12/18/2018 Document Reviewed:   12/18/2018 Elsevier Patient Education  2021 Elsevier Inc.  

## 2020-03-09 NOTE — Addendum Note (Signed)
Addended by: Colen Darling on: 03/09/2020 03:24 PM   Modules accepted: Orders

## 2020-03-10 ENCOUNTER — Ambulatory Visit: Payer: Medicaid Other

## 2020-03-23 ENCOUNTER — Telehealth: Payer: Self-pay

## 2020-03-23 NOTE — Telephone Encounter (Signed)
Pt returning a call from 03/22/20. She stated that Marchelle Folks called her on 03/22/20.

## 2020-03-23 NOTE — Telephone Encounter (Signed)
Called patient back and left message that I am returning her call.  

## 2020-03-29 ENCOUNTER — Encounter: Payer: Self-pay | Admitting: *Deleted

## 2020-03-30 ENCOUNTER — Encounter: Payer: Self-pay | Admitting: *Deleted

## 2020-05-27 ENCOUNTER — Other Ambulatory Visit: Payer: Self-pay | Admitting: Women's Health

## 2020-06-01 ENCOUNTER — Other Ambulatory Visit: Payer: Self-pay

## 2020-06-01 ENCOUNTER — Ambulatory Visit (INDEPENDENT_AMBULATORY_CARE_PROVIDER_SITE_OTHER): Payer: Medicaid Other

## 2020-06-01 DIAGNOSIS — Z3042 Encounter for surveillance of injectable contraceptive: Secondary | ICD-10-CM | POA: Diagnosis not present

## 2020-06-01 MED ORDER — MEDROXYPROGESTERONE ACETATE 150 MG/ML IM SUSY: PREFILLED_SYRINGE | Freq: Once | INTRAMUSCULAR | Status: AC

## 2020-06-01 NOTE — Progress Notes (Signed)
   NURSE VISIT- INJECTION  SUBJECTIVE:  Patricia [name] is a 35 y.o. G0P0000 female here for a Depo Provera for contraception/period management. She is a GYN patient.   OBJECTIVE:  There were no vitals taken for this visit.  Appears well, in no apparent distress  Injection administered in: Right deltoid  Meds ordered this encounter  Medications  . medroxyPROGESTERone Acetate SUSY    ASSESSMENT: GYN patient Depo Provera for contraception/period management PLAN: Follow-up: in 11-13 weeks for next Depo   Almena Hokenson A Madelyn Tlatelpa  06/01/2020 9:40 AM

## 2020-06-08 ENCOUNTER — Encounter: Payer: Self-pay | Admitting: Women's Health

## 2020-06-08 ENCOUNTER — Ambulatory Visit: Payer: Medicaid Other | Admitting: Women's Health

## 2020-06-08 ENCOUNTER — Other Ambulatory Visit: Payer: Self-pay

## 2020-06-08 VITALS — BP 135/78 | HR 75 | Ht 65.5 in | Wt 153.8 lb

## 2020-06-08 DIAGNOSIS — R221 Localized swelling, mass and lump, neck: Secondary | ICD-10-CM | POA: Diagnosis not present

## 2020-06-08 DIAGNOSIS — E041 Nontoxic single thyroid nodule: Secondary | ICD-10-CM

## 2020-06-08 NOTE — Progress Notes (Signed)
GYN VISIT Patient name: Patricia [name] MRN [id number]  Date of birth: Nov 30, 1985 Chief Complaint:   thyroid complaint (R side of neck x4 days ago, has had this for 2 years but has now worsened)  History of Present Illness:   Patricia [name] is a 35 y.o. G0P0000 Caucasian female being seen today for large painful mass on Rt side of thyroid since Friday. Hurts to swallow. Has multiple nodules, last u/s in Jan of this year: 3 nodules, Rt 2.2cm w/o significant interval change since 2019 u/s, met criteria for repeat u/s in 63yr, but not criteria for biopsy. Enlarging but sonographically low risk minimally complex cystic nodules in Rt upper and Lt mid gland did not meet criteria for biopsy or f/u.  Does not have PCP No LMP recorded. Patient has had an injection. The current method of family planning is Depo-Provera injections.  Last pap 01/22/20. Results were: ASCUS w/ HRHPV positive: other (not 16, 18/45)  [facility] 01/22/2020 01/15/2019 01/08/2017 07/19/2016  Decreased Interest 0 0 0 1  Down, Depressed, Hopeless 0 0 0 0  PHQ - 2 Score 0 0 0 1  Altered sleeping 0 - - 0  Tired, decreased energy 1 - - 0  Change in appetite 0 - - 0  Feeling bad or failure about yourself  0 - - 0  Trouble concentrating 0 - - 0  Moving slowly or fidgety/restless 0 - - 0  Suicidal thoughts 0 - - 0  PHQ-9 Score 1 - - 1     GAD 7 : Generalized Anxiety Score 01/22/2020  Nervous, Anxious, on Edge 0  Control/stop worrying 0  Worry too much - different things 0  Trouble relaxing 0  Restless 0  Easily annoyed or irritable 0  Afraid - awful might happen 0  Total GAD 7 Score 0     Review of Systems:   Pertinent items are noted in HPI Denies fever/chills, dizziness, headaches, visual disturbances, fatigue, shortness of breath, chest pain, abdominal pain, vomiting, abnormal vaginal discharge/itching/odor/irritation, problems with periods, bowel movements, urination, or intercourse unless otherwise stated above.   Pertinent History Reviewed:  Reviewed past medical,surgical, social, obstetrical and family history.  Reviewed problem list, medications and allergies. Physical Assessment:   Vitals:   06/08/20 1056  BP: 135/78  Pulse: 75  Weight: 153 lb 12.8 oz (69.8 kg)  Height: 5' 5.5" (1.664 m)  Body mass index is 25.2 kg/m.       Physical Examination:   General appearance: alert, well appearing, and in no distress  Mental status: alert, oriented to person, place, and time  Skin: warm & dry   Neck: large 3cm visible mass Rt neck, mid position, lateral to thyroid. Mobile, tender w/ upward pressure only  Cardiovascular: normal heart rate noted  Respiratory: normal respiratory effort, no distress  Abdomen: soft, non-tender   Pelvic: examination not indicated  Extremities: no edema   Chaperone: N/A    No results found for this or any previous visit (from the past 24 hour(s)).  Assessment & Plan:  1) Rt Neck/thyroid mass> 3cm, known h/o thyroid nodules (last u/s Jan of this year). Repeat thyroid u/s ASAP w/ possible biopsy, urgent referral to endocrinology. [facility]  Meds: No orders of the defined types were placed in this encounter.   Orders Placed This Encounter  Procedures  . [place]  . Ambulatory referral to Endocrinology    Return for prn.  [place]  WHNP-BC 06/08/2020 11:47 AM

## 2020-06-14 ENCOUNTER — Encounter: Payer: Self-pay | Admitting: "Endocrinology

## 2020-06-14 ENCOUNTER — Ambulatory Visit (INDEPENDENT_AMBULATORY_CARE_PROVIDER_SITE_OTHER): Payer: Medicaid Other | Admitting: "Endocrinology

## 2020-06-14 VITALS — BP 115/75 | HR 69 | Ht 65.5 in | Wt 154.0 lb

## 2020-06-14 DIAGNOSIS — E042 Nontoxic multinodular goiter: Secondary | ICD-10-CM

## 2020-06-14 NOTE — Progress Notes (Signed)
Endocrinology Consult Note                                            06/14/2020, 1:00 PM   Subjective:    Patient ID: Patricia [name], female    DOB: 12/25/1985, PCP Patient, No Pcp Per (Inactive)   Past Medical History:  Diagnosis Date   Thyroid nodule    Vaginal Pap smear, abnormal    Past Surgical History:  Procedure Laterality Date   CHOLECYSTECTOMY     Social History   Socioeconomic History   Marital status: Unknown    Spouse name: Not on file   Number of children: Not on file   Years of education: Not on file   Highest education level: Not on file  Occupational History   Not on file  Tobacco Use   Smoking status: Never   Smokeless tobacco: Never  Vaping Use   Vaping Use: Never used  Substance and Sexual Activity   Alcohol use: No   Drug use: No   Sexual activity: Yes    Birth control/protection: Injection  Other Topics Concern   Not on file  Social History Narrative   Not on file   Social Determinants of Health   Financial Resource Strain: Low Risk    Difficulty of Paying Living Expenses: Not hard at all  Food Insecurity: No Food Insecurity   Worried About Programme researcher, broadcasting/film/video in the Last Year: Never true   Ran Out of Food in the Last Year: Never true  Transportation Needs: No Transportation Needs   Lack of Transportation (Medical): No   Lack of Transportation (Non-Medical): No  Physical Activity: Inactive   Days of Exercise per Week: 0 days   Minutes of Exercise per Session: 0 min  Stress: No Stress Concern Present   Feeling of Stress : Not at all  Social Connections: Moderately Isolated   Frequency of Communication with Friends and Family: Twice a week   Frequency of Social Gatherings with Friends and Family: Once a week   Attends Religious Services: Never   Database administrator or Organizations: No   Attends Banker Meetings: Never   Marital Status: Married   History reviewed. No pertinent family history. Outpatient  Encounter Medications as of 06/14/2020  Medication Sig   medroxyPROGESTERone Acetate 150 MG/ML SUSY INJECT 1 ML INTRAMUSCULARLY EVERY 3  MONTHS   No facility-administered encounter medications on file as of 06/14/2020.   ALLERGIES: No Known Allergies  VACCINATION STATUS:  There is no immunization history on file for this patient.  HPI Patricia [name] is 35 y.o. female  presents today with a medical history as above. she is being seen in consultation for multinodular goiter requested by  Lenon Curt, NP. She is known to have multinodular goiter since at least 2019.  She did not require antithyroid intervention.  Her last thyroid ultrasound was in January 2022 when it was observed that her bilateral thyroid nodules are relatively stable/showing slight enlargement. However, she has noticed increased size of her thyroid over the last several weeks which concerns her and her provider for further study.  She is already scheduled for another ultrasound to be done next week.  She denies palpitations, tremors, nor heat/cold intolerance.  She does not have recent thyroid function test.  She denies family history of thyroid malignancy.  She is a non-smoker.  She denies any radiation exposure to the neck.  She denies neck pain.    Review of Systems  Constitutional: Minimal weight fluctuations, no fatigue, no subjective hyperthermia, no subjective hypothermia Eyes: no blurry vision, no xerophthalmia ENT: no sore throat, no nodules palpated in throat, no dysphagia/odynophagia, no hoarseness Cardiovascular: no Chest Pain, no Shortness of Breath, no palpitations, no leg swelling Respiratory: no cough, no shortness of breath Gastrointestinal: no Nausea/Vomiting/Diarhhea Musculoskeletal: no muscle/joint aches Skin: no rashes Neurological: no tremors, no numbness, no tingling, no dizziness Psychiatric: no depression, no anxiety  Objective:    Vitals with BMI 06/14/2020 06/08/2020 03/09/2020  Height 5' 5.5"  5' 5.5" 5' 5.5"  Weight 154 lbs 153 lbs 13 oz 162 lbs  BMI 25.23 25.2 26.54  Systolic 115 135 154  Diastolic 75 78 75  Pulse 69 75 76    BP 115/75   Pulse 69   Ht 5' 5.5" (1.664 m)   Wt 154 lb (69.9 kg)   BMI 25.24 kg/m   Wt Readings from Last 3 Encounters:  06/14/20 154 lb (69.9 kg)  06/08/20 153 lb 12.8 oz (69.8 kg)  03/09/20 162 lb (73.5 kg)    Physical Exam  Constitutional:  Body mass index is 25.24 kg/m.,  not in acute distress, normal state of mind Eyes: PERRLA, EOMI, no exophthalmos ENT: moist mucous membranes, + palpable thyromegaly, left > right, no no gross cervical lymphadenopathy Cardiovascular: normal precordial activity, Regular Rate and Rhythm, no Murmur/Rubs/Gallops Respiratory:  adequate breathing efforts, no gross chest deformity, Clear to auscultation bilaterally Gastrointestinal: abdomen soft, Non -tender, No distension, Bowel Sounds present, no gross organomegaly Musculoskeletal: no gross deformities, strength intact in all four extremities Skin: moist, warm, no rashes Neurological: no tremor with outstretched hands, Deep tendon reflexes normal in bilateral lower extremities.  CMP ( most recent) CMP     Component Value Date/Time   NA 141 01/08/2017 1303   K 4.0 01/08/2017 1303   CL 102 01/08/2017 1303   CO2 24 01/08/2017 1303   GLUCOSE 67 01/08/2017 1303   BUN 8 01/08/2017 1303   CREATININE 0.64 01/08/2017 1303   CALCIUM 9.4 01/08/2017 1303   PROT 7.0 01/08/2017 1303   ALBUMIN 4.6 01/08/2017 1303   AST 20 01/08/2017 1303   ALT 14 01/08/2017 1303   ALKPHOS 59 01/08/2017 1303   BILITOT 0.3 01/08/2017 1303   GFRNONAA 118 01/08/2017 1303   GFRAA 136 01/08/2017 1303        Lab Results  Component Value Date   TSH 2.960 01/08/2017        Thyroid ultrasound January 29, 2020: Right lobe measuring 5.4 cm with 2.2 cm nodule which was 2.0 cm in 2019.  Left lobe 4.8 cm with stable 1.9 cm nodule.   Assessment & Plan:   1. Multinodular  goiter   - Paige Galanti  is being seen at a kind request of Shawna Clamp, NP.  - I have reviewed her available thyroid records and clinically evaluated the patient. - Based on these reviews, she has multinodular goiter with possible recent enlargement of the left lobe since her last ultrasound.  I agree with the planned surveillance thyroid ultrasound scheduled to be done on June 16, 2020.  She will also need a complete set of thyroid function tests for which she will be sent to lab today.  -If she returns with significant change of nodular features compared to last imaging studies, she will be considered for fine-needle  aspiration biopsy. - I did not initiate any new prescriptions today. - she is advised to maintain close follow up with Patient, No Pcp Per (Inactive) for primary care needs.   - Time spent with the patient: 60 minutes, of which >50% was spent in  counseling her about her multinodular goiter and the rest in obtaining information about her symptoms, reviewing her previous labs/studies ( including abstractions from other facilities),  evaluations, and treatments,  and developing a plan to confirm diagnosis and long term treatment based on the latest standards of care/guidelines; and documenting her care.  Patricia [name] participated in the discussions, expressed understanding, and voiced agreement with the above plans.  All questions were answered to her satisfaction. she is encouraged to contact clinic should she have any questions or concerns prior to her return visit.  Follow up plan: Return in about 1 week (around 06/21/2020) for Labs Today- Non-Fasting Ok, Thyroid / Neck Ultrasound.   Marquis Lunch, MD [facility] [address] [place], [place] [address] Phone: [phone]  Fax: [phone]     06/14/2020, 1:00 PM  This note was partially dictated with voice recognition software. Similar sounding words can be  transcribed inadequately or may not  be corrected upon review.

## 2020-06-15 LAB — COMPREHENSIVE METABOLIC PANEL
ALT: 7 IU/L (ref 0–32)
AST: 12 IU/L (ref 0–40)
Albumin/Globulin Ratio: 2.3 — ABNORMAL HIGH (ref 1.2–2.2)
Albumin: 4.6 g/dL (ref 3.8–4.8)
Alkaline Phosphatase: 52 IU/L (ref 44–121)
BUN/Creatinine Ratio: 15 (ref 9–23)
BUN: 12 mg/dL (ref 6–20)
Bilirubin Total: 0.3 mg/dL (ref 0.0–1.2)
CO2: 20 mmol/L (ref 20–29)
Calcium: 9.5 mg/dL (ref 8.7–10.2)
Chloride: 104 mmol/L (ref 96–106)
Creatinine, Ser: 0.79 mg/dL (ref 0.57–1.00)
Globulin, Total: 2 g/dL (ref 1.5–4.5)
Glucose: 95 mg/dL (ref 65–99)
Potassium: 3.4 mmol/L — ABNORMAL LOW (ref 3.5–5.2)
Sodium: 138 mmol/L (ref 134–144)
Total Protein: 6.6 g/dL (ref 6.0–8.5)
eGFR: 99 mL/min/{1.73_m2} (ref >59)

## 2020-06-15 LAB — T3, FREE: T3, Free: 2.9 pg/mL (ref 2.0–4.4)

## 2020-06-15 LAB — THYROGLOBULIN ANTIBODY: Thyroglobulin Antibody: <1 IU/mL (ref 0.0–0.9)

## 2020-06-15 LAB — THYROID PEROXIDASE ANTIBODY: Thyroperoxidase Ab SerPl-aCnc: <8 IU/mL (ref 0–34)

## 2020-06-15 LAB — T4, FREE: Free T4: 1.44 ng/dL (ref 0.82–1.77)

## 2020-06-15 LAB — TSH: TSH: 2.52 u[IU]/mL (ref 0.450–4.500)

## 2020-06-16 ENCOUNTER — Other Ambulatory Visit: Payer: Self-pay

## 2020-06-16 ENCOUNTER — Ambulatory Visit (HOSPITAL_COMMUNITY)
Admission: RE | Admit: 2020-06-16 | Discharge: 2020-06-16 | Disposition: A | Discharge status: Home / Self Care | Payer: Medicaid Other | Source: Ambulatory Visit | Attending: Women's Health | Admitting: Women's Health

## 2020-06-16 DIAGNOSIS — E041 Nontoxic single thyroid nodule: Secondary | ICD-10-CM | POA: Diagnosis present

## 2020-06-25 ENCOUNTER — Encounter: Payer: Self-pay | Admitting: "Endocrinology

## 2020-06-25 ENCOUNTER — Ambulatory Visit (INDEPENDENT_AMBULATORY_CARE_PROVIDER_SITE_OTHER): Payer: Medicaid Other | Admitting: "Endocrinology

## 2020-06-25 VITALS — BP 116/76 | HR 92 | Ht 65.5 in | Wt 152.8 lb

## 2020-06-25 DIAGNOSIS — E042 Nontoxic multinodular goiter: Secondary | ICD-10-CM

## 2020-06-25 NOTE — Progress Notes (Signed)
06/25/2020, 3:46 PM  Endocrinology follow-up note   Subjective:    Patient ID: Patricia [name], female    DOB: Dec 12, 1985, PCP Patient, No Pcp Per (Inactive)   Past Medical History:  Diagnosis Date   Thyroid nodule    Vaginal Pap smear, abnormal    Past Surgical History:  Procedure Laterality Date   CHOLECYSTECTOMY     Social History   Socioeconomic History   Marital status: Unknown    Spouse name: Not on file   Number of children: Not on file   Years of education: Not on file   Highest education level: Not on file  Occupational History   Not on file  Tobacco Use   Smoking status: Never   Smokeless tobacco: Never  Vaping Use   Vaping Use: Never used  Substance and Sexual Activity   Alcohol use: No   Drug use: No   Sexual activity: Yes    Birth control/protection: Injection  Other Topics Concern   Not on file  Social History Narrative   Not on file   Social Determinants of Health   Financial Resource Strain: Low Risk    Difficulty of Paying Living Expenses: Not hard at all  Food Insecurity: No Food Insecurity   Worried About Programme researcher, broadcasting/film/video in the Last Year: Never true   Ran Out of Food in the Last Year: Never true  Transportation Needs: No Transportation Needs   Lack of Transportation (Medical): No   Lack of Transportation (Non-Medical): No  Physical Activity: Inactive   Days of Exercise per Week: 0 days   Minutes of Exercise per Session: 0 min  Stress: No Stress Concern Present   Feeling of Stress : Not at all  Social Connections: Moderately Isolated   Frequency of Communication with Friends and Family: Twice a week   Frequency of Social Gatherings with Friends and Family: Once a week   Attends Religious Services: Never   Database administrator or Organizations: No   Attends Banker Meetings: Never   Marital Status: Married   History reviewed. No pertinent family history. Outpatient  Encounter Medications as of 06/25/2020  Medication Sig   medroxyPROGESTERone Acetate 150 MG/ML SUSY INJECT 1 ML INTRAMUSCULARLY EVERY 3  MONTHS   No facility-administered encounter medications on file as of 06/25/2020.   ALLERGIES: No Known Allergies  VACCINATION STATUS:  There is no immunization history on file for this patient.  HPI Patricia [name] is 35 y.o. female  presents today with a medical history as above. she is being seen in follow-up after she was seen in consultation for multinodular goiter requested by  Lenon Curt, NP. She is known to have multinodular goiter since at least 2019.  She did not require antithyroid intervention.  Her last thyroid ultrasound was in January 2022 when it was observed that her bilateral thyroid nodules are relatively stable/showing slight enlargement. However, she has noticed increased size of her thyroid over the last several weeks which concerns her and her provider for further study.  Her repeat thyroid ultrasound confirms enlargement of the right lobe of her thyroid, steady size of the left lobe.  The bilateral nodules were reported to be stable.    She denies palpitations, tremors, nor  heat/cold intolerance.  Her recent thyroid function tests are consistent with euthyroid presentation.    She denies family history of thyroid malignancy.  She is a non-smoker.  She denies any radiation exposure to the neck.  She denies neck pain.    Review of Systems  Constitutional: Minimal weight fluctuations, no fatigue, no subjective hyperthermia, no subjective hypothermia   Objective:    Vitals with BMI 06/25/2020 06/14/2020 06/08/2020  Height 5' 5.5" 5' 5.5" 5' 5.5"  Weight 152 lbs 13 oz 154 lbs 153 lbs 13 oz  BMI 25.03 25.23 25.2  Systolic 116 115 833  Diastolic 76 75 78  Pulse 92 69 75    BP 116/76   Pulse 92   Ht 5' 5.5" (1.664 m)   Wt 152 lb 12.8 oz (69.3 kg)   BMI 25.04 kg/m   Wt Readings from Last 3 Encounters:  06/25/20 152 lb 12.8 oz  (69.3 kg)  06/14/20 154 lb (69.9 kg)  06/08/20 153 lb 12.8 oz (69.8 kg)    Physical Exam  Constitutional:  Body mass index is 25.04 kg/m.,  not in acute distress, normal state of mind Eyes: PERRLA, EOMI, no exophthalmos ENT: moist mucous membranes, + palpable asymmetric thyromegaly,  R>L.    CMP ( most recent) CMP     Component Value Date/Time   NA 138 06/14/2020 1132   K 3.4 (L) 06/14/2020 1132   CL 104 06/14/2020 1132   CO2 20 06/14/2020 1132   GLUCOSE 95 06/14/2020 1132   BUN 12 06/14/2020 1132   CREATININE 0.79 06/14/2020 1132   CALCIUM 9.5 06/14/2020 1132   PROT 6.6 06/14/2020 1132   ALBUMIN 4.6 06/14/2020 1132   AST 12 06/14/2020 1132   ALT 7 06/14/2020 1132   ALKPHOS 52 06/14/2020 1132   BILITOT 0.3 06/14/2020 1132   GFRNONAA 118 01/08/2017 1303   GFRAA 136 01/08/2017 1303        Lab Results  Component Value Date   TSH 2.520 06/14/2020   TSH 2.960 01/08/2017   FREET4 1.44 06/14/2020        Thyroid ultrasound January 29, 2020: Right lobe measuring 5.4 cm with 2.2 cm nodule which was 2.0 cm in 2019.  Left lobe 4.8 cm with stable 1.9 cm nodule.   Repeat thyroid ultrasound on June 16, 2020  No regional cervical adenopathy identified.   IMPRESSION: 1. Thyromegaly with stable bilateral nodules. None meets criteria for biopsy. 2. Recommend annual/biennial ultrasound follow-up of nodules as above, until stability x5 years confirmed.    Assessment & Plan:   1. Multinodular goiter  -She presents with interval enlargement of her thyroid on the right lobe.  I discussed both ultrasound studies and lab work with her.    Her bilateral nodules are documented to be stable in with no suspicious features.  She will not need biopsy at this time.  She also has normal thyroid function test consistent with euthyroid presentation.  She will have repeat thyroid ultrasound in thyroid function test in a year. - I did not initiate any new prescriptions today. - she is  advised to maintain close follow up with her PCP  for primary care needs.   I spent 22 minutes in the care of the patient today including review of labs from Thyroid Function, CMP, and other relevant labs ; imaging/biopsy records (current and previous including abstractions from other facilities); face-to-face time discussing  her lab results and symptoms, medications doses, her options of short and long term treatment  based on the latest standards of care / guidelines;   and documenting the encounter.  Patricia [name]  participated in the discussions, expressed understanding, and voiced agreement with the above plans.  All questions were answered to her satisfaction. she is encouraged to contact clinic should she have any questions or concerns prior to her return visit.  Follow up plan: Return in about 1 year (around 06/25/2021) for F/U with Pre-visit Labs, Thyroid / Neck Ultrasound.   Marquis Lunch, MD [facility] [address] [place], [place] [address] Phone: [phone]  Fax: [phone]     06/25/2020, 3:46 PM  This note was partially dictated with voice recognition software. Similar sounding words can be transcribed inadequately or may not  be corrected upon review.

## 2020-08-23 ENCOUNTER — Other Ambulatory Visit: Payer: Self-pay

## 2020-08-23 ENCOUNTER — Ambulatory Visit (INDEPENDENT_AMBULATORY_CARE_PROVIDER_SITE_OTHER): Payer: Medicaid Other

## 2020-08-23 DIAGNOSIS — Z3042 Encounter for surveillance of injectable contraceptive: Secondary | ICD-10-CM

## 2020-08-23 MED ORDER — MEDROXYPROGESTERONE ACETATE 150 MG/ML IM SUSY: PREFILLED_SYRINGE | Freq: Once | INTRAMUSCULAR | Status: AC

## 2020-08-23 NOTE — Progress Notes (Signed)
   NURSE VISIT- INJECTION  SUBJECTIVE:  Patricia [name] is a 35 y.o. G0P0000 female here for a Depo Provera for contraception/period management. She is a GYN patient.   OBJECTIVE:  There were no vitals taken for this visit.  Appears well, in no apparent distress  Injection administered in: Left deltoid  Meds ordered this encounter  Medications   medroxyPROGESTERone Acetate SUSY    ASSESSMENT: GYN patient Depo Provera for contraception/period management PLAN: Follow-up: in 11-13 weeks for next Depo   Patricia [name]  08/23/2020 9:09 AM

## 2020-11-15 ENCOUNTER — Ambulatory Visit (INDEPENDENT_AMBULATORY_CARE_PROVIDER_SITE_OTHER): Payer: Medicaid Other | Admitting: *Deleted

## 2020-11-15 ENCOUNTER — Other Ambulatory Visit: Payer: Self-pay

## 2020-11-15 DIAGNOSIS — Z3042 Encounter for surveillance of injectable contraceptive: Secondary | ICD-10-CM | POA: Diagnosis not present

## 2020-11-15 MED ORDER — MEDROXYPROGESTERONE ACETATE 150 MG/ML IM SUSP
150.0000 mg | Freq: Once | INTRAMUSCULAR | Status: AC
  Administered 2020-11-15: 150 mg via INTRAMUSCULAR

## 2020-11-15 NOTE — Progress Notes (Signed)
   NURSE VISIT- INJECTION  SUBJECTIVE:  Patricia [name] is a 35 y.o. G0P0000 female here for a Depo Provera for contraception/period management. She is a GYN patient.   OBJECTIVE:  There were no vitals taken for this visit.  Appears well, in no apparent distress  Injection administered in: Left deltoid  Meds ordered this encounter  Medications   medroxyPROGESTERone (DEPO-PROVERA) injection 150 mg    ASSESSMENT: GYN patient Depo Provera for contraception/period management PLAN: Follow-up: in 11-13 weeks for next Depo   Malachy Mood  11/15/2020 9:39 AM

## 2021-02-03 ENCOUNTER — Ambulatory Visit (INDEPENDENT_AMBULATORY_CARE_PROVIDER_SITE_OTHER): Payer: Medicaid Other | Admitting: Women's Health

## 2021-02-03 ENCOUNTER — Encounter: Payer: Self-pay | Admitting: Women's Health

## 2021-02-03 ENCOUNTER — Other Ambulatory Visit: Payer: Self-pay

## 2021-02-03 ENCOUNTER — Other Ambulatory Visit (HOSPITAL_COMMUNITY)
Admission: RE | Admit: 2021-02-03 | Discharge: 2021-02-03 | Disposition: A | Discharge status: Home / Self Care | Payer: Medicaid Other | Source: Ambulatory Visit | Attending: Women's Health | Admitting: Women's Health

## 2021-02-03 VITALS — BP 131/82 | HR 84 | Ht 65.5 in | Wt 164.4 lb

## 2021-02-03 DIAGNOSIS — Z3042 Encounter for surveillance of injectable contraceptive: Secondary | ICD-10-CM | POA: Diagnosis not present

## 2021-02-03 DIAGNOSIS — Z01419 Encounter for gynecological examination (general) (routine) without abnormal findings: Secondary | ICD-10-CM | POA: Diagnosis not present

## 2021-02-03 DIAGNOSIS — Z1151 Encounter for screening for human papillomavirus (HPV): Secondary | ICD-10-CM | POA: Insufficient documentation

## 2021-02-03 MED ORDER — MEDROXYPROGESTERONE ACETATE 150 MG/ML IM SUSY: PREFILLED_SYRINGE | Freq: Once | INTRAMUSCULAR | Status: AC

## 2021-02-03 NOTE — Addendum Note (Signed)
Addended by: Leilani Able, Rydan Gulyas A on: 02/03/2021 11:32 AM   Modules accepted: Orders

## 2021-02-03 NOTE — Progress Notes (Signed)
WELL-WOMAN EXAMINATION Patient name: Patricia [name] MRN [id number]  Date of birth: 03-12-1985 Chief Complaint:   Gynecologic Exam, Annual Exam, Contraception, and Injections  History of Present Illness:   Patricia [name] is a 36 y.o. G0P0000 Caucasian female being seen today for a routine well-woman exam.  Current complaints: none  PCP: Dr. Lenetta Quaker in [place]      does not desire labs No LMP recorded (lmp unknown). Patient has had an injection. The current method of family planning is Depo-Provera injections.  Last pap 01/22/20. Results were: ASCUS w/ HRHPV positive: other (not 16, 18/45), colpo CIN-I H/O abnormal pap: yes Last mammogram: never. Results were: N/A. Family h/o breast cancer: no Last colonoscopy: never. Results were: N/A. Family h/o colorectal cancer: no  Depression screen [facility] 2/9 01/22/2020 01/15/2019 01/08/2017 07/19/2016  Decreased Interest 0 0 0 1  Down, Depressed, Hopeless 0 0 0 0  PHQ - 2 Score 0 0 0 1  Altered sleeping 0 - - 0  Tired, decreased energy 1 - - 0  Change in appetite 0 - - 0  Feeling bad or failure about yourself  0 - - 0  Trouble concentrating 0 - - 0  Moving slowly or fidgety/restless 0 - - 0  Suicidal thoughts 0 - - 0  PHQ-9 Score 1 - - 1     GAD 7 : Generalized Anxiety Score 01/22/2020  Nervous, Anxious, on Edge 0  Control/stop worrying 0  Worry too much - different things 0  Trouble relaxing 0  Restless 0  Easily annoyed or irritable 0  Afraid - awful might happen 0  Total GAD 7 Score 0     Review of Systems:   Pertinent items are noted in HPI Denies any headaches, blurred vision, fatigue, shortness of breath, chest pain, abdominal pain, abnormal vaginal discharge/itching/odor/irritation, problems with periods, bowel movements, urination, or intercourse unless otherwise stated above. Pertinent History Reviewed:  Reviewed past medical,surgical, social and family history.  Reviewed problem list, medications and allergies. Physical Assessment:    Vitals:   02/03/21 1112  BP: 131/82  Pulse: 84  Weight: 164 lb 6.4 oz (74.6 kg)  Height: 5' 5.5" (1.664 m)  Body mass index is 26.94 kg/m.        Physical Examination:   General appearance - well appearing, and in no distress  Mental status - alert, oriented to person, place, and time  Psych:  She has a normal mood and affect  Skin - warm and dry, normal color, no suspicious lesions noted  Chest - effort normal, all lung fields clear to auscultation bilaterally  Heart - normal rate and regular rhythm  Neck:  midline trachea, enlarged w/ nodules  Breasts - breasts appear normal, no suspicious masses, no skin or nipple changes or  axillary nodes  Abdomen - soft, nontender, nondistended, no masses or organomegaly  Pelvic - VULVA: normal appearing vulva with no masses, tenderness or lesions  VAGINA: normal appearing vagina with normal color and discharge, no lesions  CERVIX: normal appearing cervix without discharge or lesions, no CMT  Thin prep pap is done w/ HR HPV cotesting  UTERUS: uterus is felt to be normal size, shape, consistency and nontender   ADNEXA: No adnexal masses or tenderness noted.  Extremities:  No swelling or varicosities noted  Chaperone: N/A    No results found for this or any previous visit (from the past 24 hour(s)).  Assessment & Plan:  1) Well-Woman Exam  2) Known multi-nodular thyroid goiter> followed  by Dr. Fransico Him  3) H/O abnormal pap> repeat today  4) Contraception management> depo today  Labs/procedures today: pap, depo  Mammogram: @ 38yo, or sooner if problems Colonoscopy: @ 38yo, or sooner if problems  No orders of the defined types were placed in this encounter.   Meds:  Meds ordered this encounter  Medications   medroxyPROGESTERone Acetate SUSY    Follow-up: Return in about 1 year (around 02/03/2022) for Physical.  Cheral Marker CNM, WHNP-BC 02/03/2021 11:31 AM

## 2021-02-08 ENCOUNTER — Telehealth: Payer: Self-pay | Admitting: *Deleted

## 2021-02-08 LAB — CYTOLOGY - PAP
Comment: NEGATIVE
Comment: NEGATIVE
Diagnosis: UNDETERMINED — AB
HPV 16: NEGATIVE
HPV 18 / 45: NEGATIVE
High risk HPV: POSITIVE — AB

## 2021-02-08 NOTE — Telephone Encounter (Signed)
Left message @ 2:07 pm. JSY °

## 2021-02-08 NOTE — Telephone Encounter (Signed)
Patient returning call asked that if you would please call her back

## 2021-02-08 NOTE — Telephone Encounter (Signed)
I spoke with pt. JSY °

## 2021-02-08 NOTE — Telephone Encounter (Signed)
Pt aware pap showed the same results as it showed last year.  Per current recommendations, need to repeat pap in 1 year. Pt asked why didn't we go ahead and "scrape it." I advised that the recommendations given are per current guidelines. Pt voiced understanding. JSY

## 2021-04-25 ENCOUNTER — Other Ambulatory Visit: Payer: Self-pay | Admitting: Women's Health

## 2021-04-26 ENCOUNTER — Other Ambulatory Visit: Payer: Self-pay

## 2021-04-26 MED ORDER — MEDROXYPROGESTERONE ACETATE 150 MG/ML IM SUSY: 1.0000 mL | PREFILLED_SYRINGE | INTRAMUSCULAR | 3 refills | Status: DC

## 2021-04-28 ENCOUNTER — Ambulatory Visit (INDEPENDENT_AMBULATORY_CARE_PROVIDER_SITE_OTHER): Payer: Medicaid Other

## 2021-04-28 DIAGNOSIS — Z3042 Encounter for surveillance of injectable contraceptive: Secondary | ICD-10-CM

## 2021-04-28 MED ORDER — MEDROXYPROGESTERONE ACETATE 150 MG/ML IM SUSY: PREFILLED_SYRINGE | Freq: Once | INTRAMUSCULAR | Status: AC

## 2021-04-28 NOTE — Progress Notes (Signed)
? ?  NURSE VISIT- INJECTION ? ?SUBJECTIVE:  ?Patricia [name] is a 36 y.o. G0P0000 female here for a Depo Provera for contraception/period management. She is a GYN patient.  ? ?OBJECTIVE:  ?There were no vitals taken for this visit.  ?Appears well, in no apparent distress ? ?Injection administered in: Left deltoid ? ?Meds ordered this encounter  ?Medications  ? medroxyPROGESTERone Acetate SUSY  ? ? ?ASSESSMENT: ?GYN patient Depo Provera for contraception/period management ?PLAN: ?Follow-up: in 11-13 weeks for next Depo  ? ?Matisse Salais A Avabella Wailes  ?04/28/2021 ?9:28 AM ? ?

## 2021-06-08 ENCOUNTER — Encounter: Payer: Self-pay | Admitting: "Endocrinology

## 2021-06-27 ENCOUNTER — Ambulatory Visit: Payer: Medicaid Other | Admitting: "Endocrinology

## 2021-07-20 ENCOUNTER — Ambulatory Visit (INDEPENDENT_AMBULATORY_CARE_PROVIDER_SITE_OTHER): Payer: Medicaid Other | Admitting: *Deleted

## 2021-07-20 DIAGNOSIS — Z3042 Encounter for surveillance of injectable contraceptive: Secondary | ICD-10-CM

## 2021-07-20 MED ORDER — MEDROXYPROGESTERONE ACETATE 150 MG/ML IM SUSP
150.0000 mg | Freq: Once | INTRAMUSCULAR | Status: AC
  Administered 2021-07-20: 150 mg via INTRAMUSCULAR

## 2021-07-20 NOTE — Progress Notes (Signed)
   NURSE VISIT- INJECTION  SUBJECTIVE:  Patricia [name] is a 36 y.o. G0P0000 female here for a Depo Provera for contraception/period management. She is a GYN patient.   OBJECTIVE:  There were no vitals taken for this visit.  Appears well, in no apparent distress  Injection administered in: Right deltoid  Meds ordered this encounter  Medications   medroxyPROGESTERone (DEPO-PROVERA) injection 150 mg    ASSESSMENT: GYN patient Depo Provera for contraception/period management PLAN: Follow-up: in 11-13 weeks for next Depo   Annamarie Dawley  07/20/2021 9:26 AM

## 2021-07-21 ENCOUNTER — Ambulatory Visit: Payer: Medicaid Other

## 2021-10-12 ENCOUNTER — Ambulatory Visit (INDEPENDENT_AMBULATORY_CARE_PROVIDER_SITE_OTHER): Payer: Medicaid Other | Admitting: *Deleted

## 2021-10-12 DIAGNOSIS — Z3042 Encounter for surveillance of injectable contraceptive: Secondary | ICD-10-CM | POA: Diagnosis not present

## 2021-10-12 MED ORDER — MEDROXYPROGESTERONE ACETATE 150 MG/ML IM SUSP
150.0000 mg | Freq: Once | INTRAMUSCULAR | Status: AC
  Administered 2021-10-12: 150 mg via INTRAMUSCULAR

## 2021-10-12 NOTE — Progress Notes (Signed)
   NURSE VISIT- INJECTION  SUBJECTIVE:  Patricia [name] is a 36 y.o. G0P0000 female here for a Depo Provera for contraception/period management. She is a GYN patient.   OBJECTIVE:  There were no vitals taken for this visit.  Appears well, in no apparent distress  Injection administered in: Left deltoid  Meds ordered this encounter  Medications   medroxyPROGESTERone (DEPO-PROVERA) injection 150 mg    ASSESSMENT: GYN patient Depo Provera for contraception/period management PLAN: Follow-up: in 11-13 weeks for next Depo   [name]  10/12/2021 10:07 AM

## 2022-01-04 ENCOUNTER — Ambulatory Visit (INDEPENDENT_AMBULATORY_CARE_PROVIDER_SITE_OTHER): Payer: Medicaid Other | Admitting: *Deleted

## 2022-01-04 DIAGNOSIS — Z3042 Encounter for surveillance of injectable contraceptive: Secondary | ICD-10-CM | POA: Diagnosis not present

## 2022-01-04 MED ORDER — MEDROXYPROGESTERONE ACETATE 150 MG/ML IM SUSP
150.0000 mg | Freq: Once | INTRAMUSCULAR | Status: AC
  Administered 2022-01-04: 150 mg via INTRAMUSCULAR

## 2022-01-04 NOTE — Progress Notes (Signed)
   NURSE VISIT- INJECTION  SUBJECTIVE:  Patricia [name] is a 37 y.o. G0P0000 female here for a Depo Provera for contraception/period management. She is a GYN patient.   OBJECTIVE:  There were no vitals taken for this visit.  Appears well, in no apparent distress  Injection administered in: Right deltoid  Meds ordered this encounter  Medications   medroxyPROGESTERone (DEPO-PROVERA) injection 150 mg    ASSESSMENT: GYN patient Depo Provera for contraception/period management PLAN: Follow-up: in 11-13 weeks for next Depo   Patricia [name]  01/04/2022 9:24 AM

## 2022-02-13 ENCOUNTER — Ambulatory Visit (INDEPENDENT_AMBULATORY_CARE_PROVIDER_SITE_OTHER): Payer: Medicaid Other | Admitting: Women's Health

## 2022-02-13 ENCOUNTER — Other Ambulatory Visit (HOSPITAL_COMMUNITY)
Admission: RE | Admit: 2022-02-13 | Discharge: 2022-02-13 | Disposition: A | Discharge status: Home / Self Care | Payer: Medicaid Other | Source: Ambulatory Visit | Attending: Women's Health | Admitting: Women's Health

## 2022-02-13 ENCOUNTER — Encounter: Payer: Self-pay | Admitting: Women's Health

## 2022-02-13 VITALS — BP 126/81 | HR 95 | Ht 65.5 in | Wt 181.0 lb

## 2022-02-13 DIAGNOSIS — R1030 Lower abdominal pain, unspecified: Secondary | ICD-10-CM

## 2022-02-13 DIAGNOSIS — Z01419 Encounter for gynecological examination (general) (routine) without abnormal findings: Secondary | ICD-10-CM | POA: Insufficient documentation

## 2022-02-13 DIAGNOSIS — E042 Nontoxic multinodular goiter: Secondary | ICD-10-CM | POA: Diagnosis not present

## 2022-02-13 DIAGNOSIS — R102 Pelvic and perineal pain unspecified side: Secondary | ICD-10-CM

## 2022-02-13 NOTE — Progress Notes (Signed)
WELL-WOMAN EXAMINATION Patient name: Patricia [name] MRN [id number]  Date of birth: 07-01-1985 Chief Complaint:   Gynecologic Exam  History of Present Illness:   Patricia [name] is a 37 y.o. G0P0000 Caucasian female being seen today for a routine well-woman exam.  Current complaints: intermittent lower abd/pelvic pain. Is constant when it comes, lasts anywhere from few mins to weeks. First noticed after eating, now is random. Normal bm's 2x/day. No uti sx. Saw PCP and they recommended f/u w/ [place].   PCP: Dr. Candace Cruise in [place]      does not desire labs No LMP recorded. Patient has had an injection. The current method of family planning is Depo-Provera injections, not having peridos Last pap 02/03/21. Results were: ASCUS w/ HRHPV positive: other (not 16, 18/45). H/O abnormal pap: yes Last mammogram: never. Results were: N/A. Family h/o breast cancer: no Last colonoscopy: never. Results were: N/A. Family h/o colorectal cancer: no     02/13/2022    9:04 AM 01/22/2020    9:29 AM 01/15/2019    1:41 PM 01/08/2017   11:48 AM 07/19/2016   12:06 PM  Depression screen PHQ 2/9  Decreased Interest 0 0 0 0 1  Down, Depressed, Hopeless 0 0 0 0 0  PHQ - 2 Score 0 0 0 0 1  Altered sleeping 0 0   0  Tired, decreased energy 0 1   0  Change in appetite 0 0   0  Feeling bad or failure about yourself  0 0   0  Trouble concentrating 0 0   0  Moving slowly or fidgety/restless 0 0   0  Suicidal thoughts 0 0   0  PHQ-9 Score 0 1   1        02/13/2022    9:04 AM 01/22/2020    9:30 AM  GAD 7 : Generalized Anxiety Score  Nervous, Anxious, on Edge 0 0  Control/stop worrying 0 0  Worry too much - different things 0 0  Trouble relaxing 0 0  Restless 0 0  Easily annoyed or irritable 0 0  Afraid - awful might happen 0 0  Total GAD 7 Score 0 0     Review of Systems:   Pertinent items are noted in HPI Denies any headaches, blurred vision, fatigue, shortness of breath, chest pain, abdominal pain, abnormal vaginal  discharge/itching/odor/irritation, problems with periods, bowel movements, urination, or intercourse unless otherwise stated above. Pertinent History Reviewed:  Reviewed past medical,surgical, social and family history.  Reviewed problem list, medications and allergies. Physical Assessment:   Vitals:   02/13/22 0902  BP: 126/81  Pulse: 95  Weight: 181 lb (82.1 kg)  Height: 5' 5.5" (1.664 m)  Body mass index is 29.66 kg/m.        Physical Examination:   General appearance - well appearing, and in no distress  Mental status - alert, oriented to person, place, and time  Psych:  She has a normal mood and affect  Skin - warm and dry, normal color, no suspicious lesions noted  Chest - effort normal, all lung fields clear to auscultation bilaterally  Heart - normal rate and regular rhythm  Neck:  midline trachea, enlarged w/ nodules  Breasts - breasts appear normal, no suspicious masses, no skin or nipple changes or  axillary nodes  Abdomen - soft, nontender, nondistended, no masses or organomegaly  Pelvic - VULVA: normal appearing vulva with no masses, tenderness or lesions  VAGINA: normal appearing vagina with normal color  and discharge, no lesions  CERVIX: normal appearing cervix without discharge or lesions, no CMT  Thin prep pap is done w/ HR HPV cotesting  UTERUS: uterus is felt to be normal size, shape, consistency and nontender   ADNEXA: No adnexal masses or tenderness noted.  Extremities:  No swelling or varicosities noted  Chaperone: Celene Squibb    No results found for this or any previous visit (from the past 24 hour(s)).  Assessment & Plan:  1) Well-Woman Exam  2) Lower abd/pelvic pain> will get pelvic u/s (order placed for [facility]), if neg, recommend seeing GI  3) Known multi-nodular thyroid goiter> followed by Dr. Dorris Fetch, due for f/u u/s this year. Does not currently have appt scheduled. Advised pt to call Dr. Dorris Fetch today to schedule  Labs/procedures today:  pap  Mammogram: @ 39yo, or sooner if problems Colonoscopy: @ 39yo, or sooner if problems  Orders Placed This Encounter  Procedures   US PELVIC COMPLETE WITH TRANSVAGINAL    Meds: No orders of the defined types were placed in this encounter.   Follow-up: Return for US:GYN [facility]; then 56yrfor physical.  KRoma SchanzCNM, [facility]2/12/2022 9:43 AM

## 2022-02-13 NOTE — Patient Instructions (Signed)
Call to schedule appointment with Dr. Dorris Fetch, you need another thyroid ultrasound this year

## 2022-02-16 LAB — CYTOLOGY - PAP
Chlamydia: NEGATIVE
Comment: NEGATIVE
Comment: NEGATIVE
Comment: NEGATIVE
Comment: NEGATIVE
Comment: NORMAL
Diagnosis: UNDETERMINED — AB
HPV 16: NEGATIVE
HPV 18 / 45: NEGATIVE
High risk HPV: POSITIVE — AB
Neisseria Gonorrhea: NEGATIVE

## 2022-02-27 ENCOUNTER — Encounter: Payer: Self-pay | Admitting: Women's Health

## 2022-02-27 ENCOUNTER — Ambulatory Visit (INDEPENDENT_AMBULATORY_CARE_PROVIDER_SITE_OTHER): Payer: Medicaid Other

## 2022-02-27 ENCOUNTER — Ambulatory Visit: Payer: Medicaid Other | Admitting: Women's Health

## 2022-02-27 VITALS — BP 124/83 | HR 93 | Ht 65.5 in | Wt 181.0 lb

## 2022-02-27 DIAGNOSIS — R87610 Atypical squamous cells of undetermined significance on cytologic smear of cervix (ASC-US): Secondary | ICD-10-CM

## 2022-02-27 DIAGNOSIS — R102 Pelvic and perineal pain: Secondary | ICD-10-CM

## 2022-02-27 DIAGNOSIS — R1030 Lower abdominal pain, unspecified: Secondary | ICD-10-CM

## 2022-02-27 NOTE — Progress Notes (Signed)
GYN VISIT Patient name: Patricia [name] MRN [id number]  Date of birth: [birth year] Chief Complaint:   Follow-up ([place] today)  History of Present Illness:   Patricia [name] is a 37 y.o. G0P0000 Caucasian female being seen today for f/u after pelvic u/s done for intermittent lower abd/pelvic pain. Is constant when it comes, lasts anywhere from few mins to weeks. First noticed after eating, now is random. Normal bm's 2x/day. No uti sx. Saw PCP and they recommended f/u w/ [place].  States pain has been ok since our last visit.  Also had abnormal pap on 2/12, needs colpo, does not want to do today, will schedule.     No LMP recorded. Patient has had an injection. The current method of family planning is Depo-Provera injections.  Last pap 02/13/22. Results were: ASCUS w/ HRHPV positive: other (not 16, 18/45)     02/13/2022    9:04 AM 01/22/2020    9:29 AM 01/15/2019    1:41 PM 01/08/2017   11:48 AM 07/19/2016   12:06 PM  Depression screen PHQ 2/9  Decreased Interest 0 0 0 0 1  Down, Depressed, Hopeless 0 0 0 0 0  PHQ - 2 Score 0 0 0 0 1  Altered sleeping 0 0   0  Tired, decreased energy 0 1   0  Change in appetite 0 0   0  Feeling bad or failure about yourself  0 0   0  Trouble concentrating 0 0   0  Moving slowly or fidgety/restless 0 0   0  Suicidal thoughts 0 0   0  PHQ-9 Score 0 1   1        02/13/2022    9:04 AM 01/22/2020    9:30 AM  GAD 7 : Generalized Anxiety Score  Nervous, Anxious, on Edge 0 0  Control/stop worrying 0 0  Worry too much - different things 0 0  Trouble relaxing 0 0  Restless 0 0  Easily annoyed or irritable 0 0  Afraid - awful might happen 0 0  Total GAD 7 Score 0 0     Review of Systems:   Pertinent items are noted in HPI Denies fever/chills, dizziness, headaches, visual disturbances, fatigue, shortness of breath, chest pain, abdominal pain, vomiting, abnormal vaginal discharge/itching/odor/irritation, problems with periods, bowel movements, urination, or intercourse  unless otherwise stated above.  Pertinent History Reviewed:  Reviewed past medical,surgical, social, obstetrical and family history.  Reviewed problem list, medications and allergies. Physical Assessment:   Vitals:   02/27/22 1031  BP: 124/83  Pulse: 93  Weight: 181 lb (82.1 kg)  Height: 5' 5.5" (1.664 m)  Body mass index is 29.66 kg/m.       Physical Examination:   General appearance: alert, well appearing, and in no distress  Mental status: alert, oriented to person, [name], and time  Skin: warm & dry   Cardiovascular: normal heart rate noted  Respiratory: normal respiratory effort, no distress  Abdomen: soft, non-tender   Pelvic: examination not indicated  Extremities: no edema   Chaperone: N/A    Today's Pelvic U/S: Patricia [name] is a 37 y.o. G0P0000 No LMP recorded. Patient has had an injection.She is here for a pelvic sonogram for pelvic pain x two months.   Uterus                      4.6 x 2.4 x 3.3 cm, Total uterine volume 19 cc,homogeneous anteverted uterus,WNL   Endometrium  2.3 mm, symmetrical, wnl   Right ovary             1.8 x 1 x 1.6 cm, normal    Left ovary                1.8 x 1.5 x 1.8 cm, normal    No free fluid    Technician Comments:   PELVIS TA/TV: homogeneous anteverted uterus,WNL,normal ovaries,EEC 2.3 mm,no free fluid,ovaries appear mobile,no pain during ultrasound   Chaperone [employer] 02/27/2022 10:37 AM  No results found for this or any previous visit (from the past 24 hour(s)).  Assessment & Plan:  1) Intermittent low abd/pelvic pain> today's gyn u/s normal. Not currently bothering her. Discussed if it returns, discuss getting GI referral from PCP as pain initially started after meals. Other possible gyn etiology would be endometriosis, discussed this can not be seen on u/s. If pain returns and GI work-up is also neg, can discuss this possibility.   2) Abnormal pap, needs colpo> declines today, to schedule for 1st  available  Meds: No orders of the defined types were placed in this encounter.   No orders of the defined types were placed in this encounter.   Return for 1st available, colpo.  Roma Schanz CNM, [facility] 02/27/2022 11:10 AM

## 2022-02-27 NOTE — Addendum Note (Signed)
Addended by: Roma Schanz on: 02/27/2022 01:52 PM   Modules accepted: Level of Service

## 2022-02-27 NOTE — Progress Notes (Signed)
PELVIS TA/TV: homogeneous anteverted uterus,WNL,normal ovaries,EEC 2.3 mm,no free fluid,ovaries appear mobile,no pain during ultrasound  Chaperone [employer]

## 2022-03-14 ENCOUNTER — Ambulatory Visit (INDEPENDENT_AMBULATORY_CARE_PROVIDER_SITE_OTHER): Payer: Medicaid Other | Admitting: Women's Health

## 2022-03-14 ENCOUNTER — Encounter: Payer: Self-pay | Admitting: Women's Health

## 2022-03-14 ENCOUNTER — Other Ambulatory Visit (HOSPITAL_COMMUNITY)
Admission: RE | Admit: 2022-03-14 | Discharge: 2022-03-14 | Disposition: A | Discharge status: Home / Self Care | Payer: Medicaid Other | Source: Ambulatory Visit | Attending: Women's Health | Admitting: Women's Health

## 2022-03-14 VITALS — BP 127/86 | HR 81 | Ht 65.2 in | Wt 187.4 lb

## 2022-03-14 DIAGNOSIS — Z3202 Encounter for pregnancy test, result negative: Secondary | ICD-10-CM | POA: Diagnosis not present

## 2022-03-14 DIAGNOSIS — N870 Mild cervical dysplasia: Secondary | ICD-10-CM | POA: Diagnosis not present

## 2022-03-14 DIAGNOSIS — R87610 Atypical squamous cells of undetermined significance on cytologic smear of cervix (ASC-US): Secondary | ICD-10-CM

## 2022-03-14 DIAGNOSIS — R87810 Cervical high risk human papillomavirus (HPV) DNA test positive: Secondary | ICD-10-CM | POA: Insufficient documentation

## 2022-03-14 LAB — POCT URINE PREGNANCY: Preg Test, Ur: NEGATIVE

## 2022-03-14 NOTE — Patient Instructions (Signed)
Colposcopy, Care After  The following information offers guidance on how to care for yourself after your procedure. Your health care provider may also give you more specific instructions. If you have problems or questions, contact your health care provider. What can I expect after the procedure? If you had a colposcopy without a biopsy, you can expect to feel fine right away after your procedure. However, you may have some spotting of blood for a few days. You can return to your normal activities. If you had a colposcopy with a biopsy, it is common after the procedure to have: Soreness and mild pain. These may last for a few days. Mild vaginal bleeding or discharge that is dark-colored and grainy. This may last for a few days. The discharge may be caused by a liquid (solution) that was used during the procedure. You may need to wear a sanitary pad during this time. Spotting of blood for at least 48 hours after the procedure. Follow these instructions at home: Medicines Take over-the-counter and prescription medicines only as told by your health care provider. Talk with your health care provider about what type of over-the-counter pain medicines and prescription medicines you can start to take again. It is especially important to talk with your health care provider if you take blood thinners. Activity Avoid using douche products, using tampons, and having sex for at least 3 days after the procedure or for as long as told by your health care provider. Return to your normal activities as told by your health care provider. Ask your health care provider what activities are safe for you. General instructions Ask your health care provider if you may take baths, swim, or use a hot tub. You may take showers. If you use birth control (contraception), continue to use it. Keep all follow-up visits. This is important. Contact a health care provider if: You have a fever or chills. You faint or feel  light-headed. Get help right away if: You have heavy bleeding from your vagina or pass blood clots. Heavy bleeding is bleeding that soaks through a sanitary pad in less than 1 hour. You have vaginal discharge that is abnormal, is yellow in color, or smells bad. This could be a sign of infection. You have severe pain or cramps in your lower abdomen that do not go away with medicine. Summary If you had a colposcopy without a biopsy, you can expect to feel fine right away, but you may have some spotting of blood for a few days. You can return to your normal activities. If you had a colposcopy with a biopsy, it is common to have mild pain for a few days and spotting for 48 hours after the procedure. Avoid using douche products, using tampons, and having sex for at least 3 days after the procedure or for as long as told by your health care provider. Get help right away if you have heavy bleeding, severe pain, or signs of infection. This information is not intended to replace advice given to you by your health care provider. Make sure you discuss any questions you have with your health care provider. Document Revised: 05/16/2020 Document Reviewed: 05/16/2020 Elsevier Patient Education  2023 Elsevier Inc.  

## 2022-03-14 NOTE — Progress Notes (Signed)
   COLPOSCOPY PROCEDURE NOTE Patient name: Patricia [name] MRN [id number]  Date of birth: 05/08/1985 Subjective Findings:   Patricia [name] is a 37 y.o. G0P0000 Caucasian female being seen today for a colposcopy. Indication: Abnormal pap on 02/13/22: ASCUS w/ HRHPV positive: other (not 16, 18/45)  Prior cytology:  Date Result Procedure  02/03/21 ASCUS w/ HRHPV positive: other (not 16, 18/45) None  01/22/20 ASCUS w/ HRHPV positive: other (not 16, 18/45) Colposcopy: CIN-1  01/15/19 ASCUS w/ HRHPV positive: other (not 16, 18/45) None  2019 NILM w/ HRHPV negative None  No LMP recorded. Patient has had an injection. Contraception: Depo-Provera injections. Menopausal: no. Hysterectomy: no.   Smoker: no. Immunocompromised: no.   The risks and benefits were explained and informed consent was obtained, and written copy is in chart. Pertinent History Reviewed:   Reviewed past medical,surgical, social, obstetrical and family history.  Reviewed problem list, medications and allergies. Objective Findings & Procedure:   Vitals:   03/14/22 1443  BP: 127/86  Pulse: 81  Weight: 187 lb 6.4 oz (85 kg)  Height: 5' 5.2" (1.656 m)  Body mass index is 30.99 kg/m.  Results for orders placed or performed in visit on 03/14/22 (from the past 24 hour(s))  POCT urine pregnancy   Collection Time: 03/14/22  3:03 PM  Result Value Ref Range   Preg Test, Ur Negative Negative     Time out was performed.  Speculum placed in the vagina, cervix fully visualized. SCJ: fully visualized. Cervix swabbed x 3 with acetic acid.  Acetowhitening present: Yes Cervix: no visible lesions, no mosaicism, no punctation, no abnormal vasculature, and acetowhite lesion(s) noted at 12-3 o'clock. Cervical biopsies taken at 12 & 3 o'clock and Hemostasis achieved with Monsel's solution. Vagina: vaginal colposcopy not performed Vulva: vulvar colposcopy not performed  Specimens: 2  Complications: none  Chaperone: Diona Fanti    Colposcopic  Impression & Plan:   Colposcopy findings consistent with LSIL Plan: Post biopsy instructions given, Will notify patient of results when back, and Will base plan of care on pathology results and ASCCP guidelines  Return in about 1 year (around 03/14/2023) for Pap & physical.  [place], [facility] 03/14/2022 4:55 PM

## 2022-03-20 LAB — SURGICAL PATHOLOGY

## 2022-03-23 ENCOUNTER — Telehealth: Payer: Self-pay

## 2022-03-23 NOTE — Telephone Encounter (Signed)
Patient called and stated that she was returning your call.  Would like for you to call her back.

## 2022-03-27 ENCOUNTER — Other Ambulatory Visit: Payer: Self-pay | Admitting: Women's Health

## 2022-03-28 NOTE — Telephone Encounter (Signed)
Spoke with patient. Results given 

## 2022-03-29 ENCOUNTER — Ambulatory Visit (INDEPENDENT_AMBULATORY_CARE_PROVIDER_SITE_OTHER): Payer: Medicaid Other | Admitting: *Deleted

## 2022-03-29 DIAGNOSIS — Z3042 Encounter for surveillance of injectable contraceptive: Secondary | ICD-10-CM

## 2022-03-29 MED ORDER — MEDROXYPROGESTERONE ACETATE 150 MG/ML IM SUSY
150.0000 mg | PREFILLED_SYRINGE | Freq: Once | INTRAMUSCULAR | Status: AC
  Administered 2022-03-29: 150 mg via INTRAMUSCULAR

## 2022-03-29 NOTE — Progress Notes (Signed)
   NURSE VISIT- INJECTION  SUBJECTIVE:  Patricia [name] is a 37 y.o. G0P0000 female here for a Depo Provera for contraception/period management. She is a GYN patient.   OBJECTIVE:  There were no vitals taken for this visit.  Appears well, in no apparent distress  Injection administered in: Left deltoid  Meds ordered this encounter  Medications   medroxyPROGESTERone Acetate SUSY 150 mg    ASSESSMENT: GYN patient Depo Provera for contraception/period management PLAN: Follow-up: in 11-13 weeks for next Depo   Patricia [name]  03/29/2022 9:45 AM

## 2022-05-09 ENCOUNTER — Encounter: Payer: Self-pay | Admitting: Women's Health

## 2022-05-09 ENCOUNTER — Other Ambulatory Visit (HOSPITAL_COMMUNITY)
Admission: RE | Admit: 2022-05-09 | Discharge: 2022-05-09 | Disposition: A | Discharge status: Home / Self Care | Payer: Medicaid Other | Source: Ambulatory Visit | Attending: Women's Health | Admitting: Women's Health

## 2022-05-09 ENCOUNTER — Ambulatory Visit: Payer: Medicaid Other | Admitting: Women's Health

## 2022-05-09 VITALS — BP 128/82 | HR 68 | Ht 65.0 in | Wt 187.2 lb

## 2022-05-09 DIAGNOSIS — N921 Excessive and frequent menstruation with irregular cycle: Secondary | ICD-10-CM | POA: Diagnosis not present

## 2022-05-09 DIAGNOSIS — A599 Trichomoniasis, unspecified: Secondary | ICD-10-CM | POA: Diagnosis not present

## 2022-05-09 NOTE — Progress Notes (Signed)
GYN VISIT Patient name: Patricia [name] MRN [id number]  Date of birth: May 16, 1985 Chief Complaint:   Irregular bleeding (Been on Depo for 2 years, no bleeding but started bleeding Sunday)  History of Present Illness:   Patricia [name] is a 37 y.o. G0P0000 Caucasian female being seen today for report of bleeding like a period that started Sunday while at work.No cramping. Denies abnormal discharge, itching/odor/irritation.   On depo and hasn't had a period in 33yrs. Last depo 03/29/22.    Just had pelvic u/s 02/27/22 for pelvic pain:  PELVIS TA/TV: homogeneous anteverted uterus,WNL,normal ovaries,EEC 2.3 mm,no free fluid,ovaries appear mobile,no pain during ultrasound Hasn't called Dr. Fransico Him to make f/u appt about thyroid appt yet- forgot  No LMP recorded. Patient has had an injection. The current method of family planning is Depo-Provera injections.  Last pap 02/13/22. Results were: ASCUS w/ HRHPV positive: other (not 16, 18/45), s/p colpo     02/13/2022    9:04 AM 01/22/2020    9:29 AM 01/15/2019    1:41 PM 01/08/2017   11:48 AM 07/19/2016   12:06 PM  Depression screen PHQ 2/9  Decreased Interest 0 0 0 0 1  Down, Depressed, Hopeless 0 0 0 0 0  PHQ - 2 Score 0 0 0 0 1  Altered sleeping 0 0   0  Tired, decreased energy 0 1   0  Change in appetite 0 0   0  Feeling bad or failure about yourself  0 0   0  Trouble concentrating 0 0   0  Moving slowly or fidgety/restless 0 0   0  Suicidal thoughts 0 0   0  PHQ-9 Score 0 1   1        02/13/2022    9:04 AM 01/22/2020    9:30 AM  GAD 7 : Generalized Anxiety Score  Nervous, Anxious, on Edge 0 0  Control/stop worrying 0 0  Worry too much - different things 0 0  Trouble relaxing 0 0  Restless 0 0  Easily annoyed or irritable 0 0  Afraid - awful might happen 0 0  Total GAD 7 Score 0 0     Review of Systems:   Pertinent items are noted in HPI Denies fever/chills, dizziness, headaches, visual disturbances, fatigue, shortness of breath, chest pain,  abdominal pain, vomiting, abnormal vaginal discharge/itching/odor/irritation, problems with periods, bowel movements, urination, or intercourse unless otherwise stated above.  Pertinent History Reviewed:  Reviewed past medical,surgical, social, obstetrical and family history.  Reviewed problem list, medications and allergies. Physical Assessment:   Vitals:   05/09/22 0857  BP: 128/82  Pulse: 68  Weight: 187 lb 3.2 oz (84.9 kg)  Height: 5\' 5"  (1.651 m)  Body mass index is 31.15 kg/m.       Physical Examination:   General appearance: alert, well appearing, and in no distress  Mental status: alert, oriented to person, place, and time  Skin: warm & dry   Cardiovascular: normal heart rate noted  Respiratory: normal respiratory effort, no distress  Abdomen: soft, non-tender   Pelvic: VULVA: normal appearing vulva with no masses, tenderness or lesions, VAGINA: normal appearing vagina with normal color and discharge, no lesions, CERVIX: normal appearing cervix without discharge or lesions, small amt mucousy menstrual blood at os UTERUS: uterus is normal size, shape, consistency and nontender, ADNEXA: normal adnexa in size, nontender and no masses  Extremities: no edema   Chaperone: Latisha Cresenzo    No results found for this  or any previous visit (from the past 24 hour(s)).  Assessment & Plan:  1) Breakthrough bleeding on depo> CV swab, normal pelvic u/s in Feb 2024. If CV swab neg, let me know if bleeding returns and persists  2) Multinodular goiter> supposed to f/u w/ Dr. Fransico Him, hasn't made appt, promises to call today  Meds: No orders of the defined types were placed in this encounter.   No orders of the defined types were placed in this encounter.   Return for after 2/12 for pap & physical.  Cheral Marker CNM, [facility] 05/09/2022 9:39 AM

## 2022-05-10 LAB — CERVICOVAGINAL ANCILLARY ONLY
Bacterial Vaginitis (gardnerella): NEGATIVE
Candida Glabrata: NEGATIVE
Candida Vaginitis: NEGATIVE
Chlamydia: NEGATIVE
Comment: NEGATIVE
Comment: NEGATIVE
Comment: NEGATIVE
Comment: NEGATIVE
Comment: NEGATIVE
Comment: NORMAL
Neisseria Gonorrhea: NEGATIVE
Trichomonas: POSITIVE — AB

## 2022-05-11 ENCOUNTER — Ambulatory Visit: Payer: Medicaid Other

## 2022-05-11 DIAGNOSIS — A599 Trichomoniasis, unspecified: Secondary | ICD-10-CM | POA: Insufficient documentation

## 2022-05-11 MED ORDER — METRONIDAZOLE 500 MG PO TABS: 500.0000 mg | ORAL_TABLET | Freq: Two times a day (BID) | ORAL | 0 refills | Status: DC

## 2022-05-11 NOTE — Addendum Note (Signed)
Addended by: Cheral Marker on: 05/11/2022 07:46 AM   Modules accepted: Orders

## 2022-05-12 ENCOUNTER — Ambulatory Visit (INDEPENDENT_AMBULATORY_CARE_PROVIDER_SITE_OTHER): Payer: Medicaid Other | Admitting: *Deleted

## 2022-05-12 DIAGNOSIS — Z113 Encounter for screening for infections with a predominantly sexual mode of transmission: Secondary | ICD-10-CM

## 2022-05-12 NOTE — Progress Notes (Signed)
Pt in to recollect swab to check for trich due to concerns about a false positive result. Patient has not been sexually active in the past 4 years so requested to swab again. Will discuss results next week.

## 2022-05-15 LAB — CERVICOVAGINAL ANCILLARY ONLY
Comment: NEGATIVE
Trichomonas: NEGATIVE

## 2022-06-21 ENCOUNTER — Ambulatory Visit (INDEPENDENT_AMBULATORY_CARE_PROVIDER_SITE_OTHER): Payer: Medicaid Other | Admitting: *Deleted

## 2022-06-21 DIAGNOSIS — Z3042 Encounter for surveillance of injectable contraceptive: Secondary | ICD-10-CM

## 2022-06-21 MED ORDER — MEDROXYPROGESTERONE ACETATE 150 MG/ML IM SUSY
150.0000 mg | PREFILLED_SYRINGE | Freq: Once | INTRAMUSCULAR | Status: AC
  Administered 2022-06-21: 150 mg via INTRAMUSCULAR

## 2022-06-21 NOTE — Progress Notes (Signed)
   NURSE VISIT- INJECTION  SUBJECTIVE:  Patricia [name] is a 37 y.o. G0P0000 female here for a Depo Provera for contraception/period management. She is a GYN patient.   OBJECTIVE:  There were no vitals taken for this visit.  Appears well, in no apparent distress  Injection administered in: Right deltoid  Meds ordered this encounter  Medications   medroxyPROGESTERone Acetate SUSY 150 mg    ASSESSMENT: GYN patient Depo Provera for contraception/period management PLAN: Follow-up: in 11-13 weeks for next Depo   Malachy Mood  06/21/2022 9:30 AM

## 2022-09-13 ENCOUNTER — Ambulatory Visit (INDEPENDENT_AMBULATORY_CARE_PROVIDER_SITE_OTHER): Payer: Self-pay | Admitting: *Deleted

## 2022-09-13 DIAGNOSIS — Z3042 Encounter for surveillance of injectable contraceptive: Secondary | ICD-10-CM

## 2022-09-13 MED ORDER — MEDROXYPROGESTERONE ACETATE 150 MG/ML IM SUSY
150.0000 mg | PREFILLED_SYRINGE | Freq: Once | INTRAMUSCULAR | Status: AC
  Administered 2022-09-13: 150 mg via INTRAMUSCULAR

## 2022-09-13 NOTE — Progress Notes (Signed)
   NURSE VISIT- INJECTION  SUBJECTIVE:  Patricia [name] is a 37 y.o. G0P0000 female here for a Depo Provera for contraception/period management. She is a GYN patient.   OBJECTIVE:  There were no vitals taken for this visit.  Appears well, in no apparent distress  Injection administered in: Left deltoid  Meds ordered this encounter  Medications   medroxyPROGESTERone Acetate SUSY 150 mg    ASSESSMENT: GYN patient Depo Provera for contraception/period management PLAN: Follow-up: in 11-13 weeks for next Depo   Jobe Marker  09/13/2022 10:12 AM

## 2022-09-20 IMAGING — US US THYROID
1 series · 13 of 25 positions shown · non-contrast
Comparison: Prior thyroid ultrasound 01/12/2017

CLINICAL DATA: Prior ultrasound follow-up.

EXAM:
THYROID ULTRASOUND
TECHNIQUE: Ultrasound examination of the thyroid gland and adjacent soft
tissues was performed.

[Series 1: us thyroid · 13 of 78 slices shown]
[im 1/78]
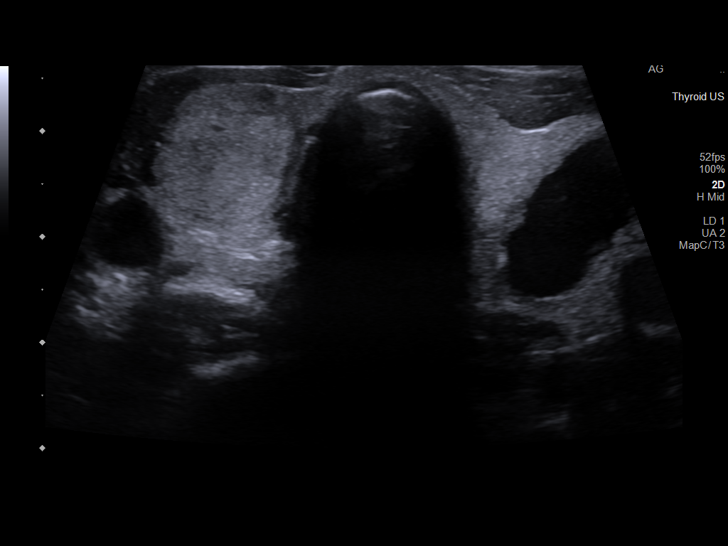
[im 7/78]
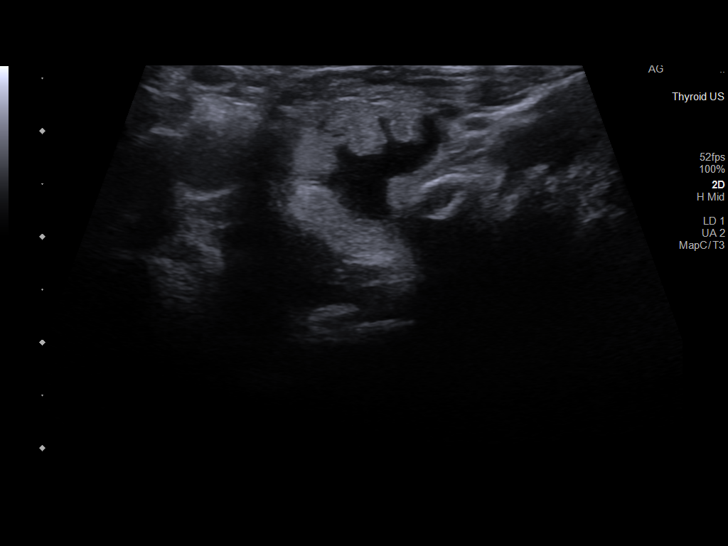
[im 13/78]
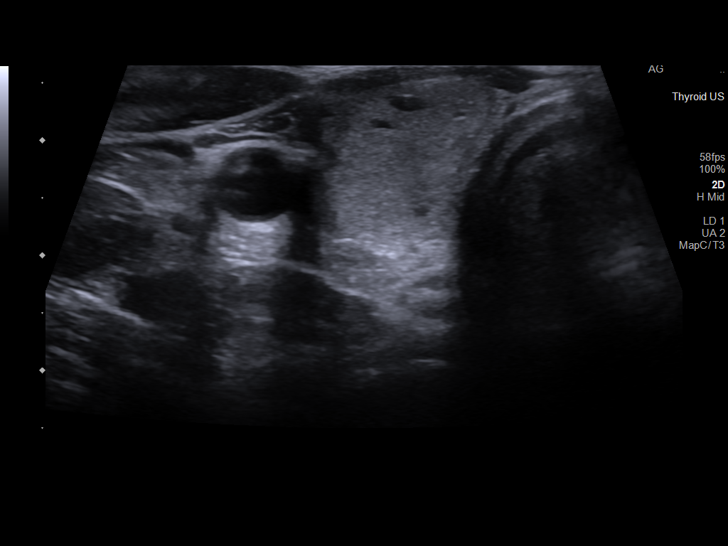
[im 20/78]
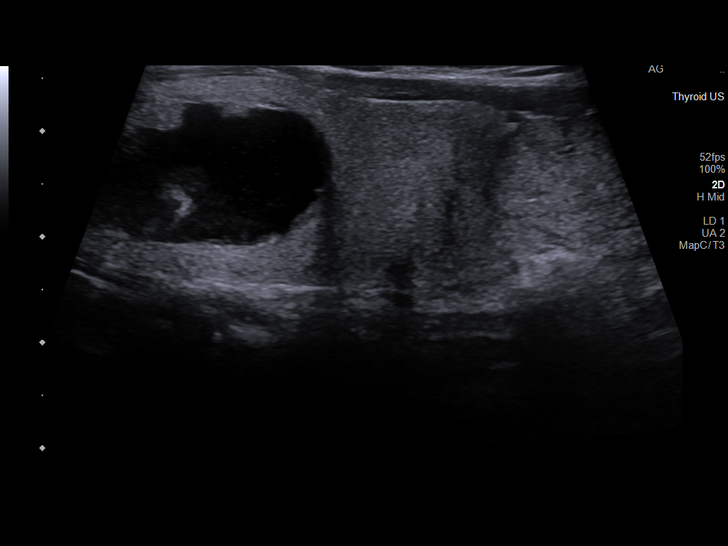
[im 26/78]
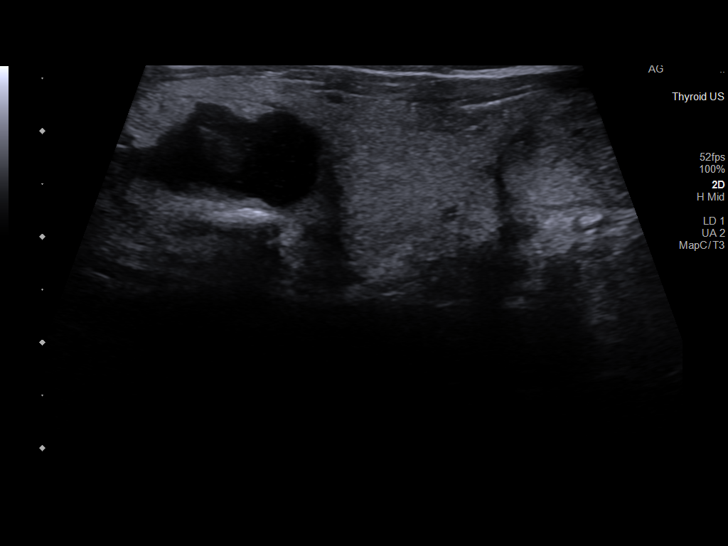
[im 33/78]
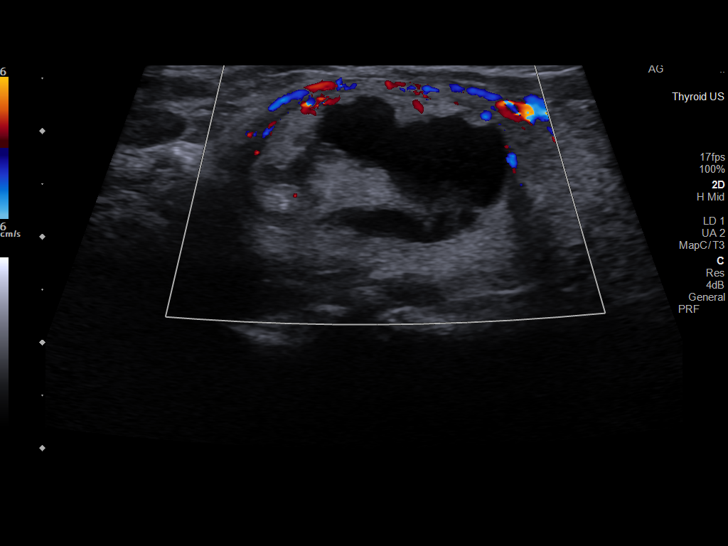
[im 39/78]
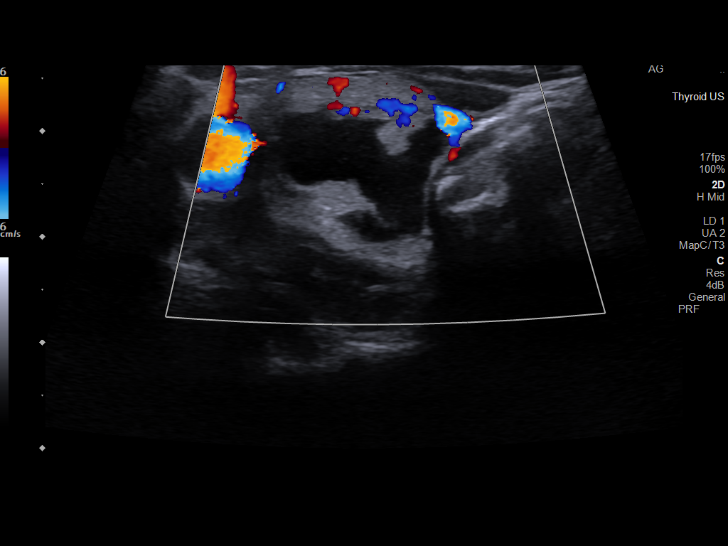
[im 45/78]
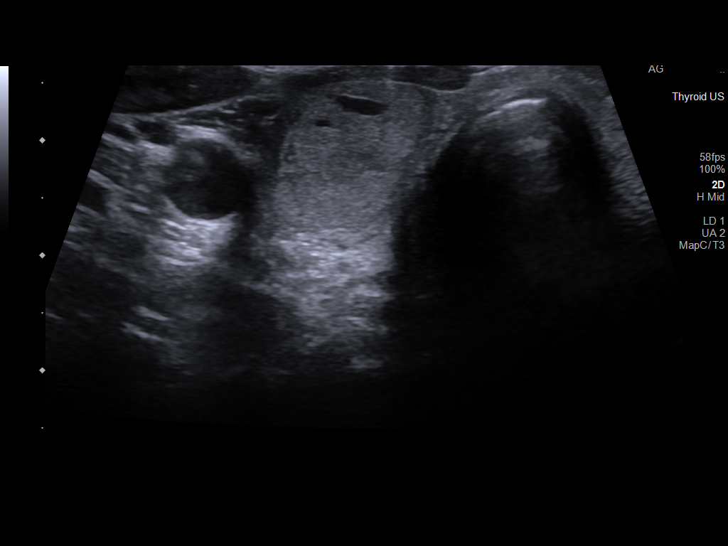
[im 52/78]
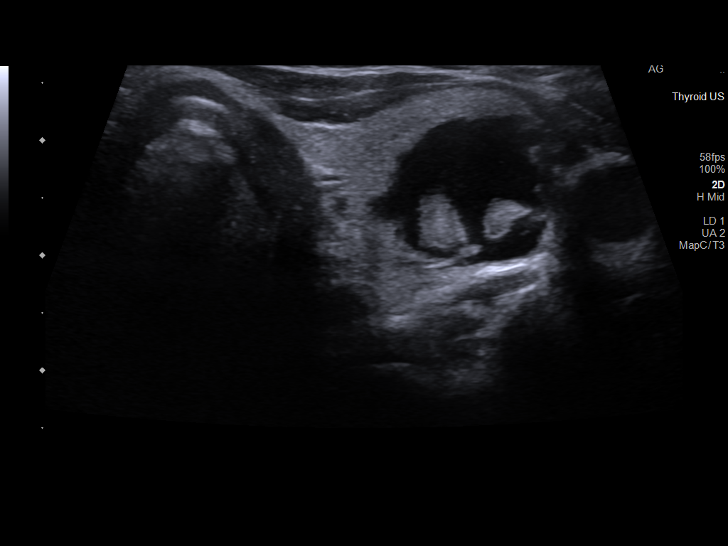
[im 58/78]
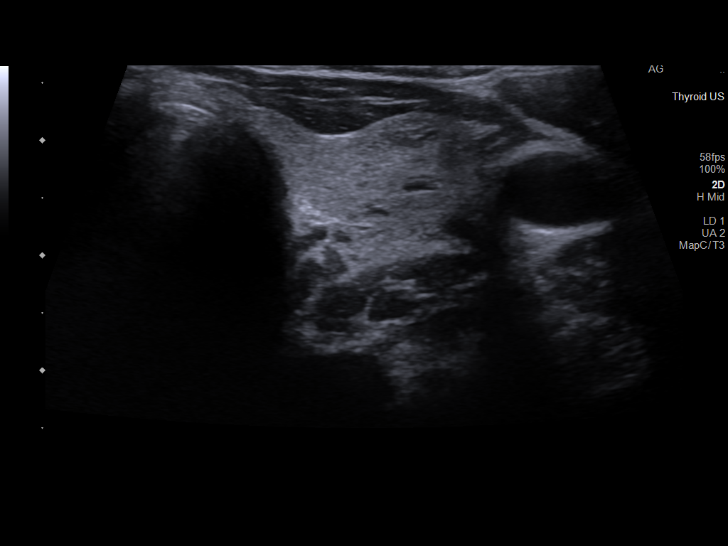
[im 65/78]
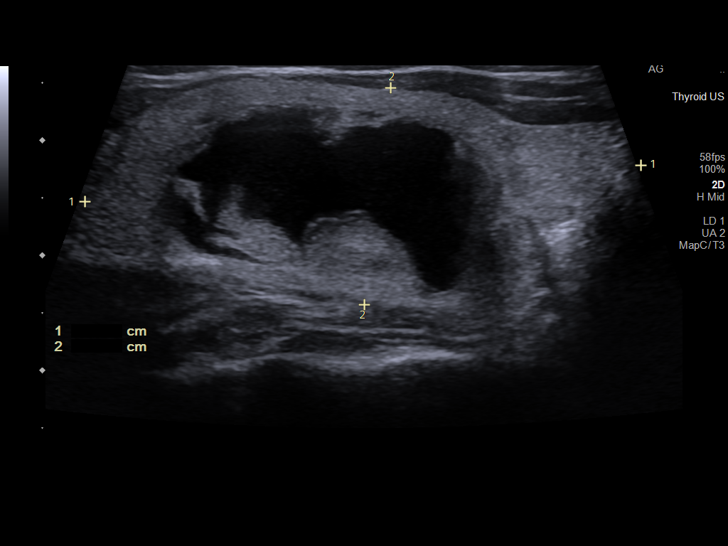
[im 71/78]
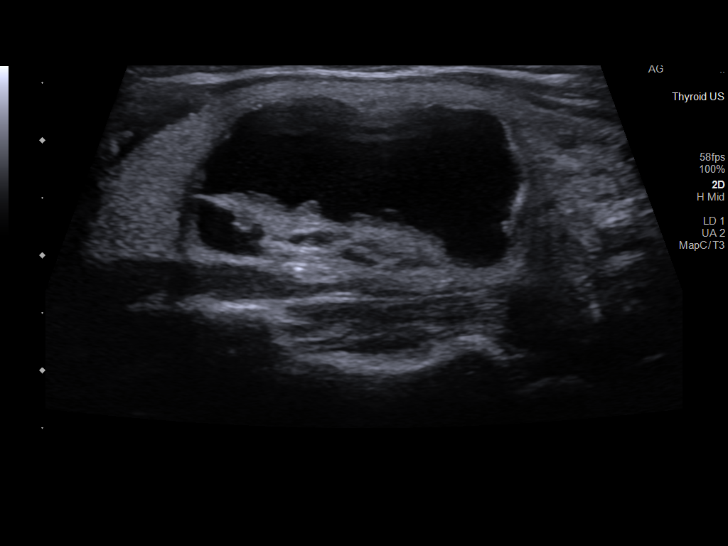
[im 78/78]
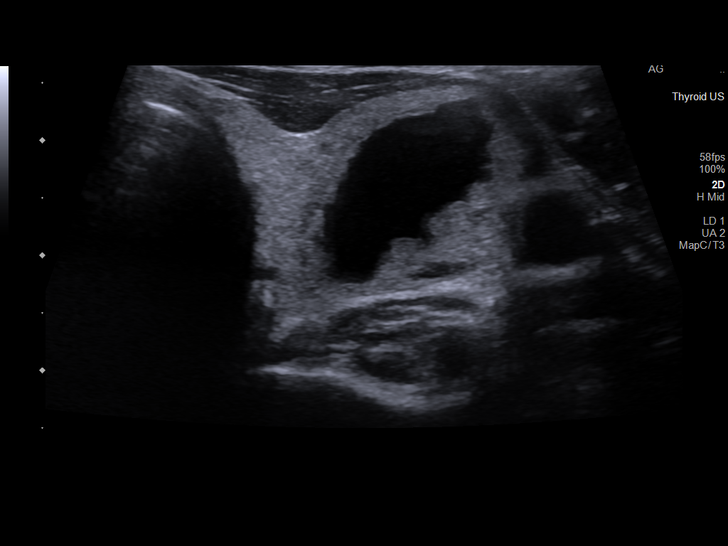

[13 of 25 positions shown; findings below may reference images not displayed]

FINDINGS: Parenchymal Echotexture: Mildly heterogenous

Isthmus: 0.2 cm

Right lobe: 5.4 x 1.8 x 1.8 cm

Left lobe: 4.8 x 1.9 x 2.6 cm

_________________________________________________________

Estimated total number of nodules >/= 1 cm: 3

Number of spongiform nodules >/=  2 cm not described below (TR1): 0

Number of mixed cystic and solid nodules >/= 1.5 cm not described
below (TR2): 0

_________________________________________________________

Nodule # 1:

Prior biopsy: No

Location: Right; Inferior

Maximum size: 2.2 cm; Other 2 dimensions: 1.2 x 1.3 cm, previously,
2.0 x 2.0 x 1.4 cm

Composition: solid/almost completely solid (2)

Echogenicity: isoechoic (1)

Shape: not taller-than-wide (0)

Margins: smooth (0)

Echogenic foci: none (0)

ACR TI-RADS total points: 3.

ACR TI-RADS risk category:  TR3 (3 points).

Significant change in size (>/= 20% in two dimensions and minimal
increase of 2 mm): No

Change in features: No

Change in ACR TI-RADS risk category: No

ACR TI-RADS recommendations:

*Given size (>/= 1.5 - 2.4 cm) and appearance, a follow-up
ultrasound in 1 year should be considered based on TI-RADS criteria.

_________________________________________________________

Nodule # 2: Mixed cystic and solid nodule in the right superior
gland has enlarged compared to the prior study but remains low risk
(TI-RADS category 2) in sonographic appearance and does not require
biopsy or further imaging follow-up.

Nodule # 3: Enlarging mixed cystic and solid nodule in the left mid
gland remains low risk (TI-RADS category 2) in sonographic
appearance and does not require biopsy or further imaging
evaluation.
IMPRESSION: 1. No significant interval change in the size or appearance of the
2.2 cm TI-RADS category 3 nodule in the right inferior gland
confirming 3 year stability. This lesion continues to meet criteria
for imaging surveillance until 5 year stability has been confirmed.
Recommend follow-up ultrasound in 2 years.
2. Enlarging but sonographically low risk minimally complex cystic
nodules in the right upper and left mid gland do not meet criteria
to recommend biopsy or dedicated imaging follow-up.

The above is in keeping with the ACR TI-RADS recommendations - [HOSPITAL] 3771;[DATE].

## 2022-12-04 ENCOUNTER — Ambulatory Visit (INDEPENDENT_AMBULATORY_CARE_PROVIDER_SITE_OTHER): Payer: Self-pay

## 2022-12-04 DIAGNOSIS — Z3042 Encounter for surveillance of injectable contraceptive: Secondary | ICD-10-CM

## 2022-12-04 MED ORDER — MEDROXYPROGESTERONE ACETATE 150 MG/ML IM SUSY
150.0000 mg | PREFILLED_SYRINGE | Freq: Once | INTRAMUSCULAR | Status: AC
  Administered 2022-12-04: 150 mg via INTRAMUSCULAR

## 2022-12-04 NOTE — Progress Notes (Signed)
   NURSE VISIT- INJECTION  SUBJECTIVE:  Patricia [name] is a 37 y.o. G0P0000 female here for a Depo Provera for contraception/period management. She is a GYN patient.   OBJECTIVE:  There were no vitals taken for this visit.  Appears well, in no apparent distress  Injection administered in: Left deltoid  Meds ordered this encounter  Medications   medroxyPROGESTERone Acetate SUSY 150 mg    ASSESSMENT: GYN patient Depo Provera for contraception/period management PLAN: Follow-up: in 11-13 weeks for next Depo   Caralyn Guile  12/04/2022 10:12 AM

## 2023-02-06 IMAGING — US US THYROID
1 series · 12 of 25 positions shown · non-contrast
Comparison: 01/29/2020 and previous

CLINICAL DATA: Right neck mass, history of thyroid nodules

EXAM:
THYROID ULTRASOUND
TECHNIQUE: Ultrasound examination of the thyroid gland and adjacent soft
tissues was performed.

[Series 1: us thyroid · 12 of 139 slices shown]
[im 6/139]
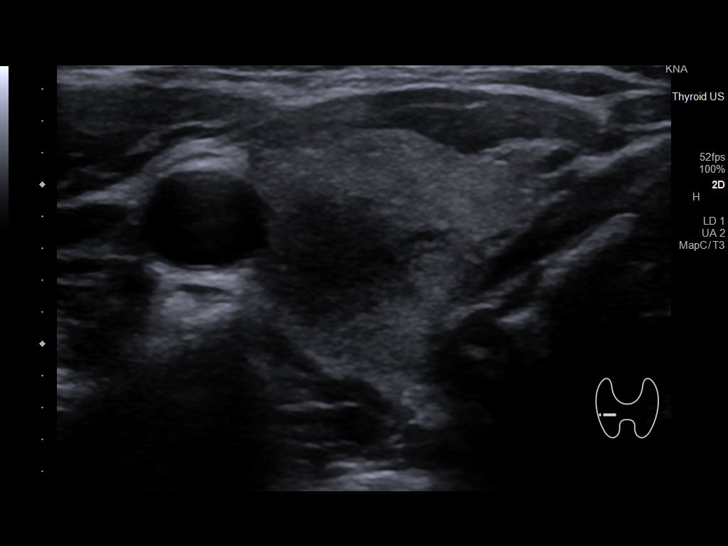
[im 18/139]
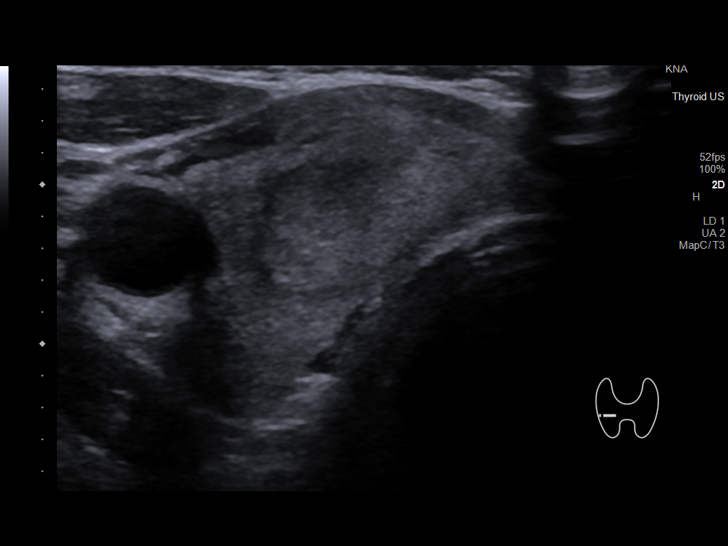
[im 29/139]
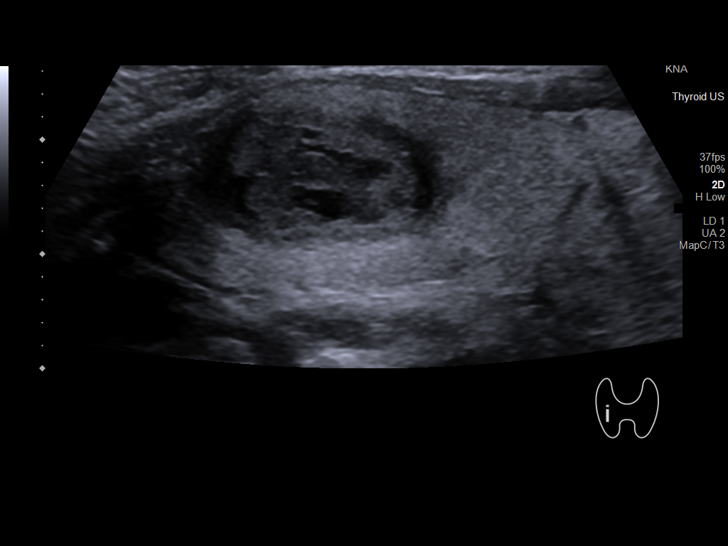
[im 41/139]
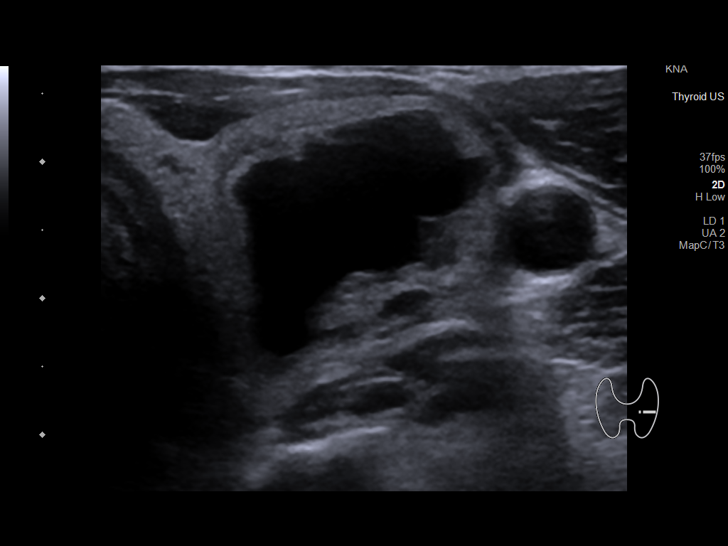
[im 52/139]
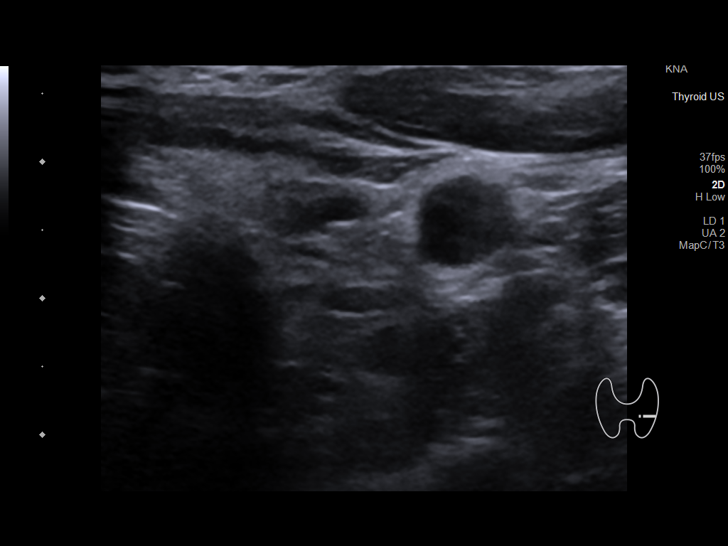
[im 64/139]
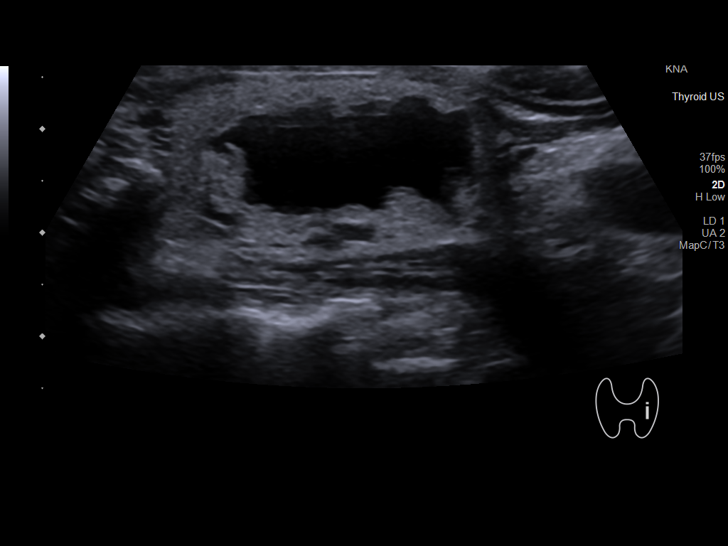
[im 75/139]
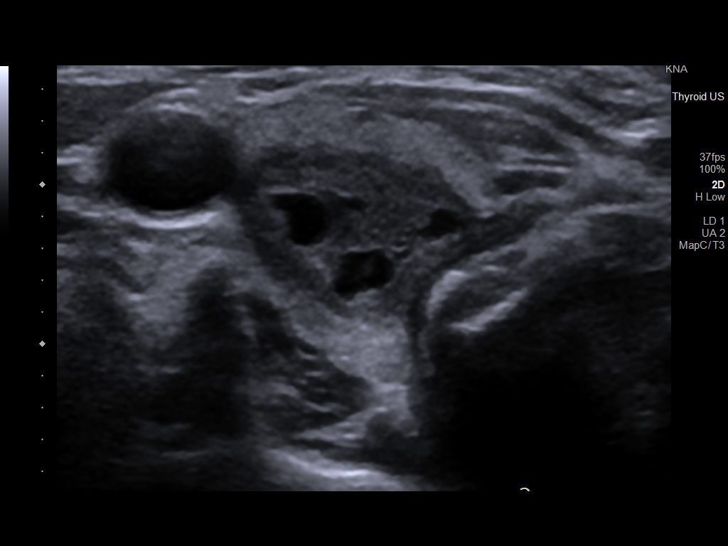
[im 87/139]
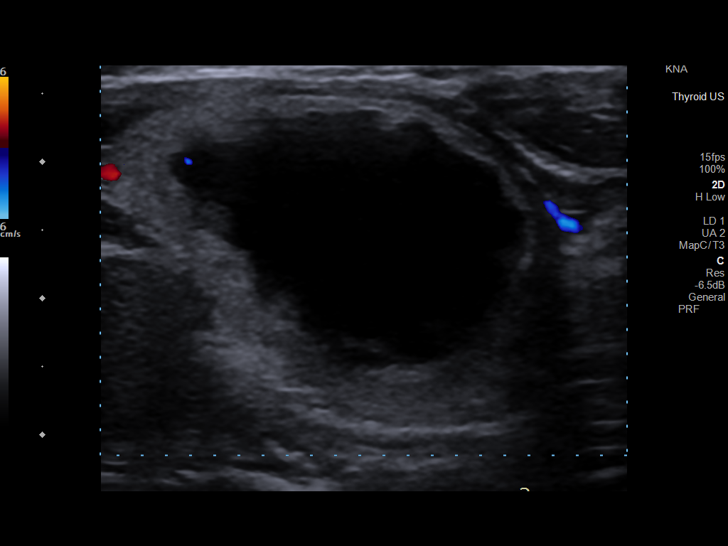
[im 98/139]
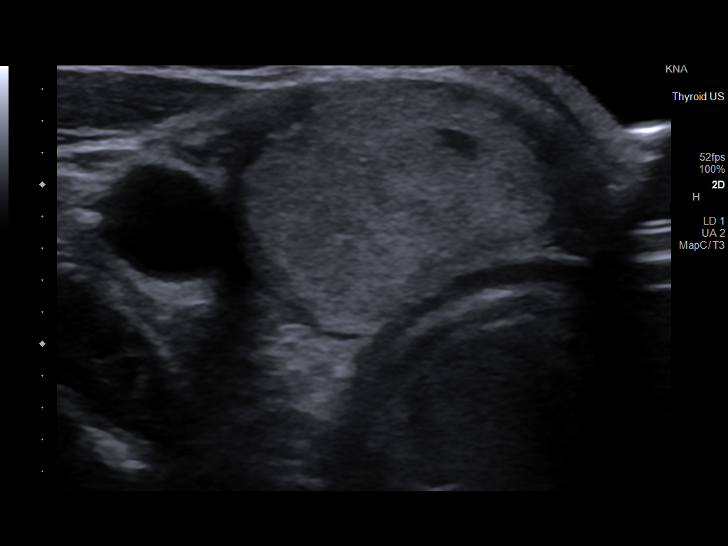
[im 110/139]
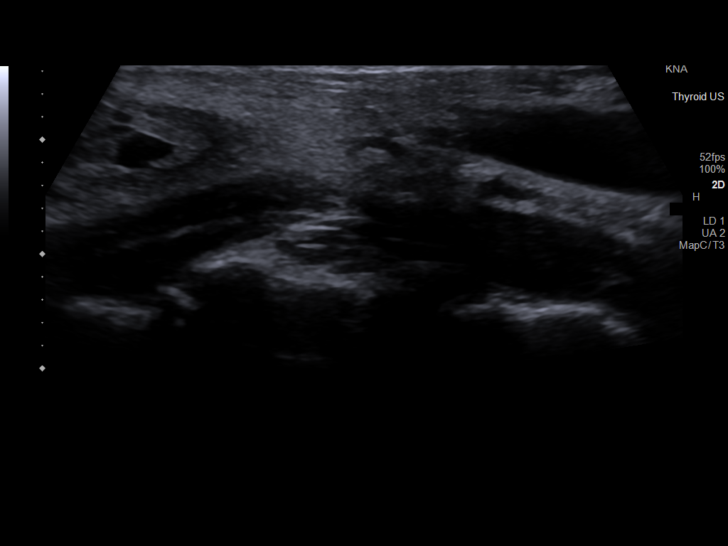
[im 121/139]
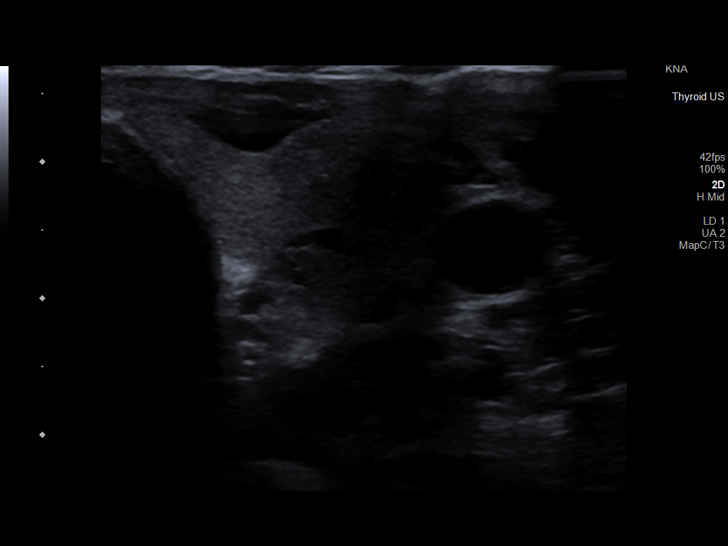
[im 133/139]
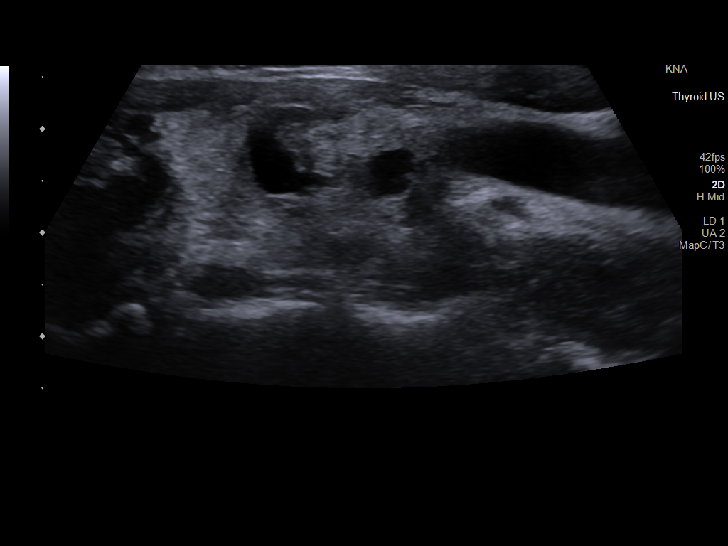

[12 of 25 positions shown; findings below may reference images not displayed]

FINDINGS: Parenchymal Echotexture: Mildly heterogenous

Isthmus: 0.2 cm thickness, stable

Right lobe: 7.1 x 2.1 x 1.9 cm, previously 5.4 x 1.8 x

Left lobe: 4.7 x 2.6 x 2.4 cm, previously 4.8 x 1.9 x

_________________________________________________________

Estimated total number of nodules >/= 1 cm: 3

Number of spongiform nodules >/=  2 cm not described below (TR1): 0

Number of mixed cystic and solid nodules >/= 1.5 cm not described
below (TR2): 0

_________________________________________________________

Nodule # 1:

Prior biopsy: No

Location: Right; inferior

Maximum size: 2.2 cm; Other 2 dimensions: 1.6 x 1.2 cm, previously,
2.4 x 1.6 x 1.4 cm

Composition: solid/almost completely solid (2)

Echogenicity: isoechoic (1)

Shape: not taller-than-wide (0)

Margins: ill-defined (0)

Echogenic foci: none (0)

ACR TI-RADS total points: 3.

ACR TI-RADS risk category:  TR3 (3 points).

Significant change in size (>/= 20% in two dimensions and minimal
increase of 2 mm): No

Change in features: No

Change in ACR TI-RADS risk category: No

ACR TI-RADS recommendations:

*Given size (>/= 1.5 - 2.4 cm) and appearance, a follow-up
ultrasound in 1 year should be considered based on TI-RADS criteria.

_________________________________________________________

Nodule # 2:

Prior biopsy: No

Location: Right; superior

Maximum size: 2.3 cm; Other 2 dimensions: 1.7 x 1.5 cm, previously,
2.2 x 1.3 x 1.2 cm

Composition: mixed cystic and solid (1)

Echogenicity: hypoechoic (2)

Shape: not taller-than-wide (0)

Margins: smooth (0)

Echogenic foci: none (0)

ACR TI-RADS total points: 3.

ACR TI-RADS risk category:  TR3 (3 points).

Significant change in size (>/= 20% in two dimensions and minimal
increase of 2 mm): No

Change in features: No

Change in ACR TI-RADS risk category: No

ACR TI-RADS recommendations:

*Given size (>/= 1.5 - 2.4 cm) and appearance, a follow-up
ultrasound in 1 year should be considered based on TI-RADS criteria.

_________________________________________________________

Nodule # 3:

Prior biopsy: No

Location: Left; mid

Maximum size: 2.9 cm; Other 2 dimensions: 2.1 x 1.8 cm, previously,
3 x 2 x 1.4 cm

Composition: mixed cystic and solid (1)

Echogenicity: isoechoic (1)

Shape: not taller-than-wide (0)

Margins: smooth (0)

Echogenic foci: none (0)

ACR TI-RADS total points: 2.

ACR TI-RADS risk category:  TR2 (2 points).

Significant change in size (>/= 20% in two dimensions and minimal
increase of 2 mm): No

Change in features: No

Change in ACR TI-RADS risk category: No

ACR TI-RADS recommendations:

This nodule does NOT meet TI-RADS criteria for biopsy or dedicated
follow-up.

_________________________________________________________

No regional cervical adenopathy identified.
IMPRESSION: 1. Thyromegaly with stable bilateral nodules. None meets criteria
for biopsy.
2. Recommend annual/biennial ultrasound follow-up of nodules as
above, until stability x5 years confirmed.

The above is in keeping with the ACR TI-RADS recommendations - [HOSPITAL] 5814;[DATE].

## 2023-02-15 ENCOUNTER — Ambulatory Visit: Payer: Self-pay | Admitting: Women's Health

## 2023-02-20 ENCOUNTER — Other Ambulatory Visit: Payer: Self-pay | Admitting: Women's Health

## 2023-02-26 ENCOUNTER — Ambulatory Visit: Payer: Self-pay

## 2023-02-26 ENCOUNTER — Encounter: Payer: Self-pay | Admitting: Women's Health

## 2023-02-26 ENCOUNTER — Ambulatory Visit: Payer: BLUE CROSS/BLUE SHIELD | Admitting: Women's Health

## 2023-02-26 ENCOUNTER — Other Ambulatory Visit (HOSPITAL_COMMUNITY)
Admission: RE | Admit: 2023-02-26 | Discharge: 2023-02-26 | Disposition: A | Discharge status: Home / Self Care | Source: Ambulatory Visit | Attending: Women's Health | Admitting: Women's Health

## 2023-02-26 VITALS — BP 122/80 | HR 92 | Ht 65.2 in | Wt 186.0 lb

## 2023-02-26 DIAGNOSIS — Z01419 Encounter for gynecological examination (general) (routine) without abnormal findings: Secondary | ICD-10-CM | POA: Insufficient documentation

## 2023-02-26 DIAGNOSIS — E041 Nontoxic single thyroid nodule: Secondary | ICD-10-CM

## 2023-02-26 DIAGNOSIS — Z8742 Personal history of other diseases of the female genital tract: Secondary | ICD-10-CM | POA: Diagnosis not present

## 2023-02-26 DIAGNOSIS — E010 Iodine-deficiency related diffuse (endemic) goiter: Secondary | ICD-10-CM | POA: Diagnosis not present

## 2023-02-26 DIAGNOSIS — Z3042 Encounter for surveillance of injectable contraceptive: Secondary | ICD-10-CM | POA: Diagnosis not present

## 2023-02-26 MED ORDER — MEDROXYPROGESTERONE ACETATE 150 MG/ML IM SUSY
150.0000 mg | PREFILLED_SYRINGE | Freq: Once | INTRAMUSCULAR | Status: AC
  Administered 2023-02-26: 150 mg via INTRAMUSCULAR

## 2023-02-26 NOTE — Addendum Note (Signed)
 Addended by: Federico Flake A on: 02/26/2023 09:37 AM   Modules accepted: Orders

## 2023-02-26 NOTE — Progress Notes (Addendum)
 WELL-WOMAN EXAMINATION Patient name: Patricia [name] MRN [id number]  Date of birth: Sep 07, 1985 Chief Complaint:   Gynecologic Exam (depo)  History of Present Illness:   Patricia [name] is a 38 y.o. G0P0000 Caucasian female being seen today for a routine well-woman exam.  Current complaints: none  PCP: [facility]      No LMP recorded. Patient has had an injection. The current method of family planning is Depo-Provera injections, not having periods Last pap 02/13/22. 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 H/O abnormal pap: yes Last mammogram: never. Results were: N/A. Family h/o breast cancer: no Last colonoscopy: never. Results were: N/A. Family h/o colorectal cancer: no     02/26/2023    8:40 AM 02/13/2022    9:04 AM 01/22/2020    9:29 AM 01/15/2019    1:41 PM 01/08/2017   11:48 AM  Depression screen PHQ 2/9  Decreased Interest 0 0 0 0 0  Down, Depressed, Hopeless 0 0 0 0 0  PHQ - 2 Score 0 0 0 0 0  Altered sleeping 0 0 0    Tired, decreased energy 0 0 1    Change in appetite 0 0 0    Feeling bad or failure about yourself  0 0 0    Trouble concentrating 0 0 0    Moving slowly or fidgety/restless 0 0 0    Suicidal thoughts 0 0 0    PHQ-9 Score 0 0 1          02/26/2023    8:41 AM 02/13/2022    9:04 AM 01/22/2020    9:30 AM  GAD 7 : Generalized Anxiety Score  Nervous, Anxious, on Edge 0 0 0  Control/stop worrying 0 0 0  Worry too much - different things 0 0 0  Trouble relaxing 0 0 0  Restless 0 0 0  Easily annoyed or irritable 0 0 0  Afraid - awful might happen 0 0 0  Total GAD 7 Score 0 0 0     Review of Systems:   Pertinent items are noted in HPI Denies any headaches, blurred vision, fatigue, shortness of breath, chest pain, abdominal pain, abnormal vaginal discharge/itching/odor/irritation, problems with periods, bowel movements, urination, or intercourse unless otherwise stated above. Pertinent History Reviewed:  Reviewed past  medical,surgical, social and family history.  Reviewed problem list, medications and allergies. Physical Assessment:   Vitals:   02/26/23 0847  BP: 122/80  Pulse: 92  Weight: 186 lb (84.4 kg)  Height: 5' 5.2" (1.656 m)  Body mass index is 30.76 kg/m.        Physical Examination:   General appearance - well appearing, and in no distress  Mental status - alert, oriented to person, place, and time  Psych:  She has a normal mood and affect  Skin - warm and dry, normal color, no suspicious lesions noted  Chest - effort normal, all lung fields clear to auscultation bilaterally  Heart - normal rate and regular rhythm  Neck:  midline trachea, thyromegaly w/ nodules  Breasts - breasts appear normal, no suspicious masses, no skin or nipple changes or  axillary nodes  Abdomen - soft, nontender, nondistended, no masses or organomegaly  Pelvic - VULVA: normal appearing vulva with no masses, tenderness or lesions  VAGINA: normal appearing vagina with normal color and discharge, no lesions  CERVIX: normal appearing cervix without discharge or lesions, no CMT  Thin prep pap is done w/ HR HPV  cotesting  UTERUS: uterus is felt to be normal size, shape, consistency and nontender   ADNEXA: No adnexal masses or tenderness noted.  Extremities:  No swelling or varicosities noted  Chaperone: Peggy Dones    No results found for this or any previous visit (from the past 24 hours).  Assessment & Plan:  1) Well-Woman Exam  2) Known multi-nodular thyroid goiter> last u/s and visit w/ Dr. Fransico Him in 2022, was supposed to f/u in 2023 but never did. Will order u/s (note routed to [facility] to schedule) and get las, gave pt number to call Dr. Isidoro Donning office to schedule appt asap to discuss results  3) Contraception management> depo today  Labs/procedures today: pap, depo  Mammogram: @ 40yo, or sooner if problems Colonoscopy: @ 40yo, or sooner if problems  Orders Placed This Encounter  Procedures   US SOFT TISSUE  HEAD & NECK (NON-THYROID)   TSH   T3   T4, free    Meds: No orders of the defined types were placed in this encounter.   Follow-up: Return in about 1 year (around 02/26/2024) for Physical, 11-13wks for Depo injection.  Cheral Marker CNM, [facility] 02/26/2023 9:13 AM

## 2023-02-26 NOTE — Addendum Note (Signed)
 Addended by: Federico Flake A on: 02/26/2023 09:19 AM   Modules accepted: Orders

## 2023-02-26 NOTE — Patient Instructions (Signed)
 Call Dr. Isidoro Donning office to schedule appointment as soon as possible [phone]

## 2023-02-26 NOTE — Addendum Note (Signed)
 Addended by: Cheral Marker on: 02/26/2023 10:51 AM   Modules accepted: Orders

## 2023-03-02 ENCOUNTER — Ambulatory Visit (HOSPITAL_COMMUNITY): Admission: RE | Admit: 2023-03-02 | Payer: BLUE CROSS/BLUE SHIELD | Source: Ambulatory Visit

## 2023-03-05 LAB — CYTOLOGY - PAP
Comment: NEGATIVE
Diagnosis: NEGATIVE
High risk HPV: NEGATIVE

## 2023-03-15 ENCOUNTER — Telehealth: Payer: Self-pay | Admitting: Women's Health

## 2023-03-15 NOTE — Telephone Encounter (Signed)
 Pt states she would like information about her last pap. Needs a call back from the office.

## 2023-03-16 NOTE — Telephone Encounter (Signed)
 Left message I called , call back monday

## 2023-05-21 ENCOUNTER — Ambulatory Visit: Payer: BLUE CROSS/BLUE SHIELD

## 2023-05-21 DIAGNOSIS — Z3042 Encounter for surveillance of injectable contraceptive: Secondary | ICD-10-CM | POA: Diagnosis not present

## 2023-05-21 MED ORDER — MEDROXYPROGESTERONE ACETATE 150 MG/ML IM SUSY
150.0000 mg | PREFILLED_SYRINGE | Freq: Once | INTRAMUSCULAR | Status: AC
  Administered 2023-05-21: 150 mg via INTRAMUSCULAR

## 2023-05-21 NOTE — Progress Notes (Signed)
   NURSE VISIT- INJECTION  SUBJECTIVE:  Patricia [name] is a 38 y.o. G0P0000 female here for a Depo Provera  for contraception/period management. She is a GYN patient.   OBJECTIVE:  There were no vitals taken for this visit.  Appears well, in no apparent distress  Injection administered in: Left deltoid  Meds ordered this encounter  Medications   medroxyPROGESTERone  Acetate SUSY 150 mg    ASSESSMENT: GYN patient Depo Provera  for contraception/period management PLAN: Follow-up: in 11-13 weeks for next Depo   [name]  05/21/2023 9:06 AM

## 2023-07-12 ENCOUNTER — Ambulatory Visit (INDEPENDENT_AMBULATORY_CARE_PROVIDER_SITE_OTHER): Admitting: *Deleted

## 2023-07-12 DIAGNOSIS — R8290 Unspecified abnormal findings in urine: Secondary | ICD-10-CM | POA: Diagnosis not present

## 2023-07-12 DIAGNOSIS — R300 Dysuria: Secondary | ICD-10-CM | POA: Diagnosis not present

## 2023-07-12 LAB — POCT URINALYSIS DIPSTICK OB
Blood, UA: NEGATIVE
Glucose, UA: NEGATIVE
Nitrite, UA: NEGATIVE

## 2023-07-12 NOTE — Progress Notes (Signed)
   NURSE VISIT- UTI SYMPTOMS   SUBJECTIVE:  Patricia [name] is a 38 y.o. G0P0000 female here for UTI symptoms. She is a GYN patient. She reports dysuria and bad smelling urine.  OBJECTIVE:  There were no vitals taken for this visit.  Appears well, in no apparent distress  Results for orders placed or performed in visit on 07/12/23 (from the past 24 hours)  POC Urinalysis Dipstick OB   Collection Time: 07/12/23 11:23 AM  Result Value Ref Range   Color, UA     Clarity, UA     Glucose, UA Negative Negative   Bilirubin, UA     Ketones, UA trace    Spec Grav, UA     Blood, UA neg    pH, UA     POC,PROTEIN,UA Moderate (2+) Negative, Trace, Small (1+), Moderate (2+), Large (3+), 4+   Urobilinogen, UA     Nitrite, UA neg    Leukocytes, UA Trace (A) Negative   Appearance     Odor      ASSESSMENT: GYN patient with UTI symptoms and negative nitrites  PLAN: Note routed to Delon Lewis, AGNP   Rx sent by provider today: No Urine culture sent Call or return to clinic prn if these symptoms worsen or fail to improve as anticipated. Follow-up: as needed   Alan LITTIE Fischer  07/12/2023 11:23 AM

## 2023-07-14 LAB — URINE CULTURE

## 2023-07-16 ENCOUNTER — Telehealth: Payer: Self-pay

## 2023-07-16 ENCOUNTER — Ambulatory Visit: Payer: Self-pay | Admitting: Adult Health

## 2023-07-16 NOTE — Telephone Encounter (Signed)
-----   Message from [place] sent at 07/16/2023  1:21 PM EDT ----- Let her know no dominant growth on urine

## 2023-07-16 NOTE — Telephone Encounter (Signed)
Left message @ 5:12 pm. JSY

## 2023-07-16 NOTE — Telephone Encounter (Signed)
 Patient would like to speak with you about her results.

## 2023-07-16 NOTE — Telephone Encounter (Signed)
 Left message @ 1:24 pm, letting pt know no dominant growth on urine culture. Advised to call with any questions. JSY

## 2023-07-18 NOTE — Telephone Encounter (Signed)
Left message @ 2:40 pm. JSY 

## 2023-07-23 NOTE — Telephone Encounter (Signed)
 Pt states she is feeling better!! Closing encounter. JSY

## 2023-08-13 ENCOUNTER — Ambulatory Visit

## 2023-08-13 DIAGNOSIS — Z3042 Encounter for surveillance of injectable contraceptive: Secondary | ICD-10-CM

## 2023-08-13 MED ORDER — MEDROXYPROGESTERONE ACETATE 150 MG/ML IM SUSY
150.0000 mg | PREFILLED_SYRINGE | Freq: Once | INTRAMUSCULAR | Status: AC
  Administered 2023-08-13 (×2): 150 mg via INTRAMUSCULAR

## 2023-08-13 NOTE — Progress Notes (Signed)
   NURSE VISIT- INJECTION  SUBJECTIVE:  Patricia [name] is a 38 y.o. G0P0000 female here for a Depo Provera  for contraception/period management. She is a GYN patient.   OBJECTIVE:  There were no vitals taken for this visit.  Appears well, in no apparent distress  Injection administered in: Left deltoid  Meds ordered this encounter  Medications   medroxyPROGESTERone  Acetate SUSY 150 mg    ASSESSMENT: GYN patient Depo Provera  for contraception/period management PLAN: Follow-up: in 11-13 weeks for next Depo   Aleck FORBES Blase  08/13/2023 9:14 AM

## 2023-11-05 ENCOUNTER — Ambulatory Visit (INDEPENDENT_AMBULATORY_CARE_PROVIDER_SITE_OTHER): Payer: Self-pay

## 2023-11-05 DIAGNOSIS — Z3042 Encounter for surveillance of injectable contraceptive: Secondary | ICD-10-CM

## 2023-11-05 MED ORDER — MEDROXYPROGESTERONE ACETATE 150 MG/ML IM SUSY
150.0000 mg | PREFILLED_SYRINGE | Freq: Once | INTRAMUSCULAR | Status: AC
  Administered 2023-11-05: 150 mg via INTRAMUSCULAR

## 2023-11-05 NOTE — Progress Notes (Signed)
   NURSE VISIT- INJECTION  SUBJECTIVE:  Patricia [name] is a 38 y.o. G0P0000 female here for a Depo Provera  for contraception/period management. She is a GYN patient.   OBJECTIVE:  There were no vitals taken for this visit.  Appears well, in no apparent distress  Injection administered in: Right deltoid  Meds ordered this encounter  Medications   medroxyPROGESTERone  Acetate SUSY 150 mg    ASSESSMENT: GYN patient Depo Provera  for contraception/period management PLAN: Follow-up: in 11-13 weeks for next Depo   Aleck FORBES Blase  11/05/2023 9:09 AM

## 2024-01-21 ENCOUNTER — Other Ambulatory Visit: Payer: Self-pay | Admitting: Women's Health

## 2024-01-28 ENCOUNTER — Ambulatory Visit: Payer: Self-pay

## 2024-01-29 ENCOUNTER — Ambulatory Visit: Payer: Self-pay

## 2024-01-30 ENCOUNTER — Ambulatory Visit: Payer: Self-pay

## 2024-01-30 DIAGNOSIS — Z3042 Encounter for surveillance of injectable contraceptive: Secondary | ICD-10-CM | POA: Diagnosis not present

## 2024-01-30 MED ORDER — MEDROXYPROGESTERONE ACETATE 150 MG/ML IM SUSY
150.0000 mg | PREFILLED_SYRINGE | Freq: Once | INTRAMUSCULAR | Status: AC
  Administered 2024-01-30: 150 mg via INTRAMUSCULAR

## 2024-01-30 NOTE — Progress Notes (Signed)
" ° °  NURSE VISIT- INJECTION  SUBJECTIVE:  Patricia [name] is a 39 y.o. G0P0000 female here for a Depo Provera  for contraception/period management. She is a GYN patient.   OBJECTIVE:  There were no vitals taken for this visit.  Appears well, in no apparent distress  Injection administered in: Left deltoid  Meds ordered this encounter  Medications   medroxyPROGESTERone  Acetate SUSY 150 mg    ASSESSMENT: GYN patient Depo Provera  for contraception/period management PLAN: Follow-up: in 11-13 weeks for next Depo   Rutherford Rover  01/30/2024 8:39 AM  "

## 2024-03-03 ENCOUNTER — Ambulatory Visit: Payer: Self-pay | Admitting: Women's Health

## 2024-04-23 ENCOUNTER — Ambulatory Visit
